# Patient Record
Sex: Female | Born: 1970 | Race: Black or African American | Hispanic: No | Smoking: Never smoker
Health system: Southern US, Community
[De-identification: ages and names within clinical notes are randomized; demographics above are authoritative.]

## PROBLEM LIST (undated history)

## (undated) DIAGNOSIS — G4700 Insomnia, unspecified: Secondary | ICD-10-CM

## (undated) DIAGNOSIS — D219 Benign neoplasm of connective and other soft tissue, unspecified: Secondary | ICD-10-CM

## (undated) DIAGNOSIS — I10 Essential (primary) hypertension: Secondary | ICD-10-CM

## (undated) DIAGNOSIS — F329 Major depressive disorder, single episode, unspecified: Secondary | ICD-10-CM

## (undated) DIAGNOSIS — F32A Depression, unspecified: Secondary | ICD-10-CM

## (undated) DIAGNOSIS — C530 Malignant neoplasm of endocervix: Secondary | ICD-10-CM

## (undated) DIAGNOSIS — D649 Anemia, unspecified: Secondary | ICD-10-CM

## (undated) DIAGNOSIS — F419 Anxiety disorder, unspecified: Secondary | ICD-10-CM

## (undated) DIAGNOSIS — F319 Bipolar disorder, unspecified: Secondary | ICD-10-CM

## (undated) HISTORY — DX: Malignant neoplasm of endocervix: C53.0

## (undated) HISTORY — PX: ABDOMINAL HYSTERECTOMY: SHX81

---

## 1996-03-26 HISTORY — PX: OTHER SURGICAL HISTORY: SHX169

## 2007-03-18 ENCOUNTER — Inpatient Hospital Stay (HOSPITAL_COMMUNITY): Admission: EM | Admit: 2007-03-18 | Discharge: 2007-03-19 | Payer: Self-pay | Admitting: *Deleted

## 2007-09-24 HISTORY — PX: DILATION AND CURETTAGE OF UTERUS: SHX78

## 2007-10-24 ENCOUNTER — Encounter (INDEPENDENT_AMBULATORY_CARE_PROVIDER_SITE_OTHER): Payer: Self-pay | Admitting: Obstetrics and Gynecology

## 2007-10-24 ENCOUNTER — Ambulatory Visit (HOSPITAL_COMMUNITY): Admission: RE | Admit: 2007-10-24 | Discharge: 2007-10-24 | Payer: Self-pay | Admitting: Obstetrics and Gynecology

## 2010-08-08 NOTE — Consult Note (Signed)
[name], Howard             ACCOUNT NO.:  [id number]   MEDICAL RECORD NO.:  [id number]          PATIENT TYPE:  INP   LOCATION:  5120                         FACILITY:  [facility]   PHYSICIAN:  Maisie Fus A. Cornett, M.D.DATE OF BIRTH:  25-May-1970   DATE OF CONSULTATION:  03/18/2007  DATE OF DISCHARGE:                                 CONSULTATION   GENERAL SURGICAL CONSULTATION   REASON FOR CONSULTATION:  Epigastric abdominal pain, nausea and  vomiting.   HISTORY OF PRESENT ILLNESS:  The patient is a 40 year old female with a  one day history of severe epigastric pain, severe nausea and severe  vomiting.  This started earlier today and came on suddenly, was located  in both right and left upper quadrants and was a 10 out of 10 with no  radiation.  This was associated with severe nausea and vomiting.  According to her mother she has been fasting for one week on her own.  She is trying to lose weight.  She was evaluated by Dr. Stacie Acres and  ultrasound showed significant sludge in her gallbladder but no stones.  There was some ascites.  She had a white count of 18,000 and a CO2 of 9.  Her lactate was normal at 1.4.  Given these findings, I was asked to see  the patient at the request of Dr. Stacie Acres in consultation for abdominal  pain.   PAST MEDICAL HISTORY:  None.   PAST SURGICAL HISTORY:  None.   MEDICATIONS:  Yaz birth control.   SOCIAL HISTORY:  Denies tobacco or alcohol use.   ALLERGIES TO MEDICINES:  No known drug allergies.   FAMILY HISTORY:  Noncontributory.   REVIEW OF SYSTEMS:  A 15 point review of systems is as above.  Please  see otherwise.   PHYSICAL EXAMINATION:  VITAL SIGNS:  Temperature is 97.  Blood pressure  is 145/92.  Respiratory rate is 19.  Pulse rate 109.  GENERAL APPEARANCE:  Obese female lying in bed in mild distress.  HEENT:  Extraocular movements intact.  Mucous membranes are dry.  NECK:  Supple, nontender.  Trachea midline without lymphadenopathy.  CHEST:  Lung sounds clear bilaterally.  Chest wall motion normal.  CARDIOVASCULAR:  Regular rate and rhythm without rub, murmur or gallop.  EXTREMITIES:  Warm, well perfused.  ABDOMEN:  Tender both over right upper and left upper quadrants.  Not  much tenderness in the left lower quadrant and right lower quadrant.  No  obvious rebound or guarding.  Bowel sounds are present.  Slight  distention.  EXTREMITIES:  Muscle tone normal.  Range of motion normal.  NEUROLOGICAL:  Examination shows motor and sensory function are grossly  intact.  Glasgow coma scale is 15.   DIAGNOSTIC STUDIES:  Ultrasound of the abdomen shows gallbladder with  sludge.  No obvious signs of cholecystitis but there is ascites.  She  has a white count of 18,000 with a left shift.  Her hemoglobin and  hematocrit are 13 and 38.  Platelet count is 263,000.  Lipase is  elevated at 75.  Sodium 134, potassium 4.4, chloride 107, CO2 9.  Glucose 101, BUN 5, creatinine 0.85.  Liver function tests normal.  Urinalysis is relatively unremarkable.  Lactate is 1.4.   IMPRESSION:  Abdominal pain.   PLAN:  At this point in time it is unclear if this is acute  cholecystitis, pancreatitis, gallstone pancreatitis or if she has peptic  ulcer disease.  She is having some vomiting which the mother showed me  is coffee-ground but not frank blood.  In any event, I feel an abdominal  CT scan would be more useful in this setting to try to get a better  grasp of what her pain is from.  She may require a HIDA study or  gastrointestinal work up to further evaluate exactly what is going on  with her.  She has been fasting but no history of any strange dietary  changes to explain this.  We will get her admitted for IV fluids and  further workup.      Thomas A. Cornett, M.D.  Electronically Signed     TAC/MEDQ  D:  03/18/2007  T:  03/18/2007  Job:  [id number]

## 2010-08-08 NOTE — H&P (Signed)
Shelby Howard, PROFIT              ACCOUNT NO.:  [id number]   MEDICAL RECORD NO.:  [id number]          PATIENT TYPE:   LOCATION:                                 FACILITY:   PHYSICIAN:  Shelby Howard, M.D.      DATE OF BIRTH:   DATE OF ADMISSION:  DATE OF DISCHARGE:                              HISTORY & PHYSICAL   CHIEF COMPLAINTS:  Breakthrough bleeding on birth control pills with  endometrial masses.   HISTORY OF PRESENT ILLNESS:  The patient is the 40 year old gravida 1,  para 1 who presented complaining of irregular vaginal bleeding.  This  started occurring in January.  She had missed some pills.  On the month  prior to this, she had not missed any pills and said she had bleeding  right before the placebo pack.  The patient is currently on Yaz.  She  does have a few fibroids on ultrasound.  She is on no new medications,  has no menopausal symptoms or vaginal discharge and denies any abdominal  pain or increased stress.  On ultrasound and sonohysterogram her uterus  measured 8.38 x 5.61 x 4.33 with normal adnexa.  She had two fibroids.  One was posterior with a large dimension being 1.78 cm and one an  anterior being 1.77 cm.  On ultrasound and sonohysterogram there were  hyperechoic masses.  The fundus measures 0.8 x 0.57 and a mass  posteriorly measuring 1.91 x 0.83, more consistent with polyps.   PAST MEDICAL HISTORY:  As above.   PAST SURGICAL HISTORY:  Unremarkable.   SOCIAL HISTORY:  Significant for tobacco use, but denies any illicit  drug use and has occasional alcohol use.   REVIEW OF SYSTEMS:  GENITOURINARY:  Breakthrough bleeding and history of  abnormal Pap smear.  HEMATOLOGICAL:  No bleeding disorders.  ENDOCRINE:  No thyroid disease.  CARDIOVASCULAR:  No heart disease.  RESPIRATORY:  No history of asthma.   PHYSICAL EXAMINATION:  VITAL SIGNS:  The patient weighs 234 pounds,  pulse 64, blood pressure 124/80.  HEENT:  Pupils equal.  Hearing is normal.   Throat is clear.  NECK:  Thyroid is not enlarged.  CARDIOVASCULAR:  Heart is regular rate and rhythm.  LUNGS:  Clear to auscultation bilaterally.  BACK:  No CVA tenderness.  ABDOMEN:  Nontender without any mass or organomegaly.  EXTREMITIES:  No cyanosis, clubbing, edema.  VAGINAL:  Within normal limits.  Vagina has normal.  Cervix nontender  without any lesions.  She did have brown discharge from residual blood.  Adnexa had no masses.   LABORATORY DATA:  Urine pregnancy test negative.  Ultrasound as above.  Hemoglobin was 12.2.  The patient decided she wants a hysteroscopy and  D&C to remove the endometrial masses and remain on Yaz.  She understands  the risks are but not limited to bleeding, infection, damage to internal  organs such as bowel, bladder, major blood vessels.      Shelby Howard, M.D.  Electronically Signed     NAD/MEDQ  D:  10/23/2007  T:  10/24/2007  Job:  [id number]

## 2010-08-08 NOTE — Op Note (Signed)
NAMEVIRGA, Shelby Howard              ACCOUNT NO.:  [id number]   MEDICAL RECORD NO.:  [id number]          PATIENT TYPE:  AMB   LOCATION:  SDC                           FACILITY:  [facility]   PHYSICIAN:  Naima A. Dillard, M.D. DATE OF BIRTH:  01-21-1971   DATE OF PROCEDURE:  10/24/2007  DATE OF DISCHARGE:                               OPERATIVE REPORT   PREOPERATIVE DIAGNOSIS:  Breakthrough bleeding and endometrial masses.   POSTOPERATIVE DIAGNOSIS:  Breakthrough bleeding.   PROCEDURE:  Dilation and curettage, hysteroscopy.   SURGEON:  Naima A. Dillard, MD   ASSISTANT:  None.   ANESTHESIA:  General.   FINDINGS:  Atrophic cavity, both ostia visualized.  No endometrial  masses seen.   SPECIMENS:  Endometrial curettings sent to Pathology.   ESTIMATED BLOOD LOSS:  Minimal.   COMPLICATIONS:  None.   The deficit was 5 mL of sorbitol.   DISPOSITION:  The patient went to PACU in stable condition.   PROCEDURE IN DETAIL:  The patient was taken to the operating room, where  she was given general anesthesia, placed in dorsolithotomy position, and  prepped and draped in a normal sterile fashion.  A bivalve speculum was  placed into the vagina.  The anterior lip of the cervix was grasped with  a single-tooth tenaculum, 20 mL was used for cervical block.  The cervix  was dilated with Pratt dilators up to 21.  The hysteroscope was placed  into the uterine cavity, it was totally normal and atrophic.  Both ostia  were visualized.  No endometrial masses or submucosal fibroids were  seen.  We did let the fluid out of the cavity and watch it collapse and  then re-inflated with the fluid and again no endometrial polyps or  masses were seen.  Hysteroscope was removed from the uterus.  A sharp  curettage  was done and the endometrial curettings were sent to Pathology.  All  instruments were removed from the vagina and cervix.  Sponge, lap, and  needle counts were correct.  Hemostasis was assured at  tenaculum site  using silver nitrate and pressure.  The patient went to PACU in stable  condition.      Naima A. Normand Sloop, M.D.  Electronically Signed     NAD/MEDQ  D:  10/24/2007  T:  10/25/2007  Job:  [id number]

## 2010-08-08 NOTE — H&P (Signed)
NAMEDESERA, GRAFFEO              ACCOUNT NO.:  [id number]   MEDICAL RECORD NO.:  [id number]          PATIENT TYPE:   LOCATION:                                 FACILITY:   PHYSICIAN:  Naima A. Dillard, M.D.      DATE OF BIRTH:   DATE OF ADMISSION:  DATE OF DISCHARGE:                              HISTORY & PHYSICAL   CHIEF COMPLAINTS:  Breakthrough bleeding on birth control pills with  endometrial masses.   HISTORY OF PRESENT ILLNESS:  The patient is the 40 year old gravida 1,  para 1 who presented complaining of irregular vaginal bleeding.  This  started occurring in January.  She had missed some pills.  On the month  prior to this, she had not missed any pills and said she had bleeding  right before the placebo pack.  The patient is currently on Yaz.  She  does have a few fibroids on ultrasound.  She is on no new medications,  has no menopausal symptoms or vaginal discharge and denies any abdominal  pain or increased stress.  On ultrasound and sonohysterogram her uterus  measured 8.38 x 5.61 x 4.33 with normal adnexa.  She had two fibroids.  One was posterior with a large dimension being 1.78 cm and one an  anterior being 1.77 cm.  On ultrasound and sonohysterogram there were  hyperechoic masses.  The fundus measures 0.8 x 0.57 and a mass  posteriorly measuring 1.91 x 0.83, more consistent with polyps.   PAST MEDICAL HISTORY:  As above.   PAST SURGICAL HISTORY:  Unremarkable.   SOCIAL HISTORY:  Significant for tobacco use, but denies any illicit  drug use and has occasional alcohol use.   REVIEW OF SYSTEMS:  GENITOURINARY:  Breakthrough bleeding and history of  abnormal Pap smear.  HEMATOLOGICAL:  No bleeding disorders.  ENDOCRINE:  No thyroid disease.  CARDIOVASCULAR:  No heart disease.  RESPIRATORY:  No history of asthma.   PHYSICAL EXAMINATION:  VITAL SIGNS:  The patient weighs 234 pounds,  pulse 64, blood pressure 124/80.  HEENT:  Pupils equal.  Hearing is normal.   Throat is clear.  NECK:  Thyroid is not enlarged.  CARDIOVASCULAR:  Heart is regular rate and rhythm.  LUNGS:  Clear to auscultation bilaterally.  BACK:  No CVA tenderness.  ABDOMEN:  Nontender without any mass or organomegaly.  EXTREMITIES:  No cyanosis, clubbing, edema.  VAGINAL:  Within normal limits.  Vagina has normal.  Cervix nontender  without any lesions.  She did have brown discharge from residual blood.  Adnexa had no masses.   LABORATORY DATA:  Urine pregnancy test negative.  Ultrasound as above.  Hemoglobin was 12.2.  The patient decided she wants a hysteroscopy and  D&C to remove the endometrial masses and remain on Yaz.  She understands  the risks are but not limited to bleeding, infection, damage to internal  organs such as bowel, bladder, major blood vessels.      Naima A. Normand Sloop, M.D.     NAD/MEDQ  D:  10/23/2007  T:  10/24/2007  Job:  [id number]

## 2010-08-11 NOTE — Discharge Summary (Signed)
NAMEDAINELLE, HUN              ACCOUNT NO.:  [id number]   MEDICAL RECORD NO.:  [id number]          PATIENT TYPE:  INP   LOCATION:  5120                         FACILITY:  [facility]   PHYSICIAN:  Lennie Muckle, MD      DATE OF BIRTH:  May 06, 1970   DATE OF ADMISSION:  03/17/2007  DATE OF DISCHARGE:  03/19/2007                               DISCHARGE SUMMARY   FINAL DIAGNOSES:  Gastroenteritis.   HOSPITAL COURSE:  Ms. Hollar is a 40 year old female who was admitted  with nausea and vomiting and pain throughout most of the abdomen. Workup  included an ultrasound as well as a Hida scan. Ultrasound revealed  question of some sludge within the gallbladder. No stones. Hida scan was  performed, which was negative. She did receive IV fluids and anti-nausea  medication over the course of the day with hydration. She began to  improve in her nausea and vomiting and complete resolution on the day of  discharge, March 19, 2007. Had normalization of her white count and  had no further complaints of abdominal pain.   FINAL DIAGNOSES:  Gastroenteritis.   DISPOSITION:  Discharge to home. No need for surgical followup. Is  released to her own care.   FOLLOWUP:  With primary care physician on an as needed basis.      Lennie Muckle, MD  Electronically Signed     ALA/MEDQ  D:  04/16/2007  T:  04/16/2007  Job:  [id number]

## 2010-12-22 LAB — CBC
Hemoglobin: 12.1
RBC: 4.35
RDW: 13.7

## 2010-12-29 LAB — CBC
HCT: 42.1
MCV: 82.1
Platelets: 263
RBC: 5.17 — ABNORMAL HIGH
RDW: 14.5
RDW: 14.8
WBC: 20.2 — ABNORMAL HIGH

## 2010-12-29 LAB — COMPREHENSIVE METABOLIC PANEL
ALT: 11
AST: 17
AST: 19
Albumin: 3.7
Alkaline Phosphatase: 73
BUN: 3 — ABNORMAL LOW
CO2: 10 — ABNORMAL LOW
Calcium: 8.4
Calcium: 9.2
Chloride: 104
Chloride: 107
Creatinine, Ser: 0.8
Creatinine, Ser: 0.85
GFR calc Af Amer: >60
GFR calc Af Amer: >60
GFR calc non Af Amer: >60
Glucose, Bld: 135 — ABNORMAL HIGH
Potassium: 4.4
Potassium: 5.1
Sodium: 130 — ABNORMAL LOW
Sodium: 134 — ABNORMAL LOW
Total Bilirubin: 0.9
Total Protein: 8.2

## 2010-12-29 LAB — LIPASE, BLOOD: Lipase: 75 — ABNORMAL HIGH

## 2010-12-29 LAB — DIFFERENTIAL
Basophils Relative: 0
Eosinophils Absolute: 0 — ABNORMAL LOW
Eosinophils Relative: 0
Lymphocytes Relative: 6 — ABNORMAL LOW
Lymphs Abs: 1
Monocytes Relative: 5
Neutro Abs: 17.3 — ABNORMAL HIGH
Neutrophils Relative %: 93 — ABNORMAL HIGH

## 2010-12-29 LAB — URINALYSIS, ROUTINE W REFLEX MICROSCOPIC
Protein, ur: NEGATIVE
Specific Gravity, Urine: 1.015

## 2010-12-29 LAB — LACTIC ACID, PLASMA: Lactic Acid, Venous: 1.4

## 2011-01-01 ENCOUNTER — Other Ambulatory Visit: Payer: Self-pay | Admitting: Obstetrics and Gynecology

## 2011-01-05 ENCOUNTER — Other Ambulatory Visit: Payer: Self-pay | Admitting: Obstetrics and Gynecology

## 2011-02-06 ENCOUNTER — Other Ambulatory Visit (HOSPITAL_COMMUNITY): Payer: BC Managed Care – PPO

## 2011-02-14 ENCOUNTER — Encounter (HOSPITAL_COMMUNITY): Payer: Self-pay | Admitting: Pharmacist

## 2011-02-14 NOTE — Progress Notes (Signed)
Called pt to advise to stop Phentermine by 11/23.  She verbalized understanding.

## 2011-02-22 ENCOUNTER — Encounter (HOSPITAL_COMMUNITY): Payer: Self-pay

## 2011-02-22 ENCOUNTER — Encounter (HOSPITAL_COMMUNITY)
Admission: RE | Admit: 2011-02-22 | Discharge: 2011-02-22 | Disposition: A | Discharge status: Home / Self Care | Payer: BC Managed Care – PPO | Source: Ambulatory Visit | Attending: Obstetrics and Gynecology | Admitting: Obstetrics and Gynecology

## 2011-02-22 HISTORY — DX: Insomnia, unspecified: G47.00

## 2011-02-22 HISTORY — DX: Major depressive disorder, single episode, unspecified: F32.9

## 2011-02-22 HISTORY — DX: Anxiety disorder, unspecified: F41.9

## 2011-02-22 HISTORY — DX: Anemia, unspecified: D64.9

## 2011-02-22 HISTORY — DX: Bipolar disorder, unspecified: F31.9

## 2011-02-22 HISTORY — DX: Depression, unspecified: F32.A

## 2011-02-22 HISTORY — DX: Benign neoplasm of connective and other soft tissue, unspecified: D21.9

## 2011-02-22 LAB — CBC
HCT: 32 % — ABNORMAL LOW (ref 36.0–46.0)
MCH: 26.5 pg (ref 26.0–34.0)
MCHC: 32.8 g/dL (ref 30.0–36.0)
MCV: 80.8 fL (ref 78.0–100.0)
RDW: 16.7 % — ABNORMAL HIGH (ref 11.5–15.5)

## 2011-02-22 LAB — BASIC METABOLIC PANEL
BUN: 16 mg/dL (ref 6–23)
Chloride: 102 mEq/L (ref 96–112)
Creatinine, Ser: 0.73 mg/dL (ref 0.50–1.10)
GFR calc Af Amer: >90 mL/min (ref >90)

## 2011-02-22 NOTE — Pre-Procedure Instructions (Signed)
Ok to see patient DOS. 

## 2011-02-22 NOTE — Patient Instructions (Addendum)
   Your procedure is scheduled on:  Friday, Dec 7th  Enter through the [employer] [facility] at: 8:00am Pick up the phone at the desk and dial [phone] and inform us of your arrival.  Please call this number if you have any problems the morning of surgery: [phone]  Remember: Do not eat food after midnight: Thursday Do not drink clear liquids after: Thursday Take these medicines the morning of surgery with a SIP OF WATER:  Wellbutrin and Xanax if needed  Do not wear jewelry, make-up, or FINGER nail polish Do not wear lotions, powders, perfumes or deodorant. Do not shave 48 hours prior to surgery. Do not bring valuables to the hospital.  Patients discharged on the day of surgery will not be allowed to drive home.   Home with mother Addelynn Batte  cell [phone]  Remember to use your hibiclens as instructed.Please shower with 1/2 bottle the evening before your surgery and the other 1/2 bottle the morning of surgery.

## 2011-03-01 NOTE — H&P (Signed)
Shelby Howard is an 40 y.o. female. 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  Pt presented with complaint of heavy vaginal bleeding and pap was significant for LGSIL.  Pt then had coploscopy .    Pertinent Gynecological History: Menses: flow is excessive with use of 15-20 pads or tampons on heaviest days Bleeding: see above Contraception: abstinence DES exposure: unknown Blood transfusions: none Sexually transmitted diseases: HPV Previous GYN Procedures: DNC  Last mammogram: na Date: na Last pap: abnormal: see above Date: 1012 OB History: G1, P1   Menstrual History: Menarche age: 37 No LMP recorded.    Past Medical History  Diagnosis Date  . Fibroids   . Bipolar disorder   . Anxiety   . Depression   . Insomnia   . Anemia     history    Past Surgical History  Procedure Date  . Dilation and curettage of uterus 09/2007    resection of polyp  . Svd 1998    x 1    No family history on file.  Social History:  reports that she has never smoked. She has never used smokeless tobacco. She reports that she drinks about 2.4 ounces of alcohol per week. She reports that she does not use illicit drugs.  Allergies: No Known Allergies  No prescriptions prior to admission    Review of Systems  Constitutional: Negative.   HENT: Negative.   Eyes: Negative.   Respiratory: Negative.   Gastrointestinal: Negative.   Genitourinary: Negative.   Musculoskeletal: Negative.   Skin: Negative.   Neurological: Negative.   Endo/Heme/Allergies: Negative.   Psychiatric/Behavioral: Negative.     There were no vitals taken for this visit. Physical Exam .108/76 wt 255 p88 Physical Examination: General appearance - alert, well appearing, and in no distress Mouth - mucous membranes moist, pharynx normal without lesions Neck - supple, no significant adenopathy Chest - clear to auscultation, no wheezes, rales or rhonchi, symmetric air entry Heart - normal  rate, regular rhythm, normal S1, S2, no murmurs, rubs, clicks or gallops Abdomen - soft, nontender, nondistended, no masses or organomegaly Breasts - breasts appear normal, no suspicious masses, no skin or nipple changes or axillary nodes Pelvic - normal external genitalia, vulva, vagina, cervix, uterus and adnexa Musculoskeletal - no joint tenderness, deformity or swelling Extremities - peripheral pulses normal, no pedal edema, no clubbing or cyanosis No results found for this or any previous visit (from the past 24 hour(s)).  No results found. colpo  Adenocarcinoma in situon colpo.   EMBX benign   Assessment/Plan: Plan CKC  Pt understands risks of bleeding, infection and damage to the cervix  Lliam Hoh A 03/01/2011, 6:06 PM

## 2011-03-02 ENCOUNTER — Other Ambulatory Visit: Payer: Self-pay | Admitting: Obstetrics and Gynecology

## 2011-03-02 ENCOUNTER — Ambulatory Visit (HOSPITAL_COMMUNITY)
Admission: RE | Admit: 2011-03-02 | Discharge: 2011-03-02 | Disposition: A | Discharge status: Home / Self Care | Payer: BC Managed Care – PPO | Source: Ambulatory Visit | Attending: Obstetrics and Gynecology | Admitting: Obstetrics and Gynecology

## 2011-03-02 ENCOUNTER — Encounter (HOSPITAL_COMMUNITY)
Admission: RE | Disposition: A | Discharge status: Home / Self Care | Payer: Self-pay | Source: Ambulatory Visit | Attending: Obstetrics and Gynecology

## 2011-03-02 ENCOUNTER — Encounter (HOSPITAL_COMMUNITY): Payer: Self-pay | Admitting: Anesthesiology

## 2011-03-02 ENCOUNTER — Ambulatory Visit (HOSPITAL_COMMUNITY): Payer: BC Managed Care – PPO | Admitting: Anesthesiology

## 2011-03-02 ENCOUNTER — Encounter (HOSPITAL_COMMUNITY): Payer: Self-pay | Admitting: *Deleted

## 2011-03-02 DIAGNOSIS — Z01812 Encounter for preprocedural laboratory examination: Secondary | ICD-10-CM | POA: Insufficient documentation

## 2011-03-02 DIAGNOSIS — D069 Carcinoma in situ of cervix, unspecified: Secondary | ICD-10-CM | POA: Insufficient documentation

## 2011-03-02 DIAGNOSIS — Z01818 Encounter for other preprocedural examination: Secondary | ICD-10-CM | POA: Insufficient documentation

## 2011-03-02 HISTORY — PX: CERVICAL CONIZATION W/BX: SHX1330

## 2011-03-02 SURGERY — CONE BIOPSY, CERVIX
Anesthesia: Choice

## 2011-03-02 MED ORDER — MIDAZOLAM HCL 5 MG/5ML IJ SOLN
INTRAMUSCULAR | Status: DC | PRN
  Administered 2011-03-02: 2 mg via INTRAVENOUS

## 2011-03-02 MED ORDER — FENTANYL CITRATE 0.05 MG/ML IJ SOLN: 25.0000 ug | INTRAMUSCULAR | Status: DC | PRN

## 2011-03-02 MED ORDER — PROPOFOL 10 MG/ML IV EMUL
INTRAVENOUS | Status: AC
  Filled 2011-03-02: qty 20

## 2011-03-02 MED ORDER — KETOROLAC TROMETHAMINE 30 MG/ML IJ SOLN
INTRAMUSCULAR | Status: DC | PRN
  Administered 2011-03-02: 30 mg via INTRAVENOUS

## 2011-03-02 MED ORDER — LACTATED RINGERS IV SOLN
INTRAVENOUS | Status: DC
  Administered 2011-03-02: 09:00:00 via INTRAVENOUS

## 2011-03-02 MED ORDER — IBUPROFEN 200 MG PO TABS: 200.0000 mg | ORAL_TABLET | Freq: Four times a day (QID) | ORAL | Status: AC | PRN

## 2011-03-02 MED ORDER — VASOPRESSIN 20 UNIT/ML IJ SOLN
INTRAMUSCULAR | Status: DC | PRN
  Administered 2011-03-02: 20 [IU]

## 2011-03-02 MED ORDER — ACETIC ACID 4% SOLUTION
Status: DC | PRN
  Administered 2011-03-02: 1 via TOPICAL

## 2011-03-02 MED ORDER — HYDROCODONE-ACETAMINOPHEN 5-500 MG PO TABS: 1.0000 | ORAL_TABLET | Freq: Four times a day (QID) | ORAL | Status: AC | PRN

## 2011-03-02 MED ORDER — FENTANYL CITRATE 0.05 MG/ML IJ SOLN
INTRAMUSCULAR | Status: AC
  Filled 2011-03-02: qty 2

## 2011-03-02 MED ORDER — ONDANSETRON HCL 4 MG/2ML IJ SOLN
INTRAMUSCULAR | Status: AC
  Filled 2011-03-02: qty 2

## 2011-03-02 MED ORDER — KETOROLAC TROMETHAMINE 30 MG/ML IJ SOLN
INTRAMUSCULAR | Status: AC
  Filled 2011-03-02: qty 1

## 2011-03-02 MED ORDER — MIDAZOLAM HCL 2 MG/2ML IJ SOLN
INTRAMUSCULAR | Status: AC
  Filled 2011-03-02: qty 2

## 2011-03-02 MED ORDER — FENTANYL CITRATE 0.05 MG/ML IJ SOLN
INTRAMUSCULAR | Status: DC | PRN
  Administered 2011-03-02 (×2): 100 ug via INTRAVENOUS

## 2011-03-02 MED ORDER — LIDOCAINE HCL (CARDIAC) 20 MG/ML IV SOLN
INTRAVENOUS | Status: AC
  Filled 2011-03-02: qty 10

## 2011-03-02 MED ORDER — METOCLOPRAMIDE HCL 5 MG/ML IJ SOLN: 10.0000 mg | Freq: Once | INTRAMUSCULAR | Status: DC | PRN

## 2011-03-02 MED ORDER — ONDANSETRON HCL 4 MG/2ML IJ SOLN
INTRAMUSCULAR | Status: DC | PRN
  Administered 2011-03-02: 4 mg via INTRAVENOUS

## 2011-03-02 MED ORDER — PROPOFOL 10 MG/ML IV EMUL
INTRAVENOUS | Status: DC | PRN
  Administered 2011-03-02: 200 mg via INTRAVENOUS

## 2011-03-02 MED ORDER — DEXAMETHASONE SODIUM PHOSPHATE 4 MG/ML IJ SOLN
INTRAMUSCULAR | Status: DC | PRN
  Administered 2011-03-02: 10 mg via INTRAVENOUS

## 2011-03-02 MED ORDER — MEPERIDINE HCL 25 MG/ML IJ SOLN: 6.2500 mg | INTRAMUSCULAR | Status: DC | PRN

## 2011-03-02 SURGICAL SUPPLY — 27 items
APPLICATOR COTTON TIP 6IN STRL (MISCELLANEOUS) IMPLANT
BLADE SURG 11 STRL SS (BLADE) ×2 IMPLANT
CLOTH BEACON ORANGE TIMEOUT ST (SAFETY) ×2 IMPLANT
CONTAINER PREFILL 10% NBF 60ML (FORM) ×2 IMPLANT
COUNTER NEEDLE 1200 MAGNETIC (NEEDLE) IMPLANT
ELECT REM PT RETURN 9FT ADLT (ELECTROSURGICAL) ×2
ELECTRODE REM PT RTRN 9FT ADLT (ELECTROSURGICAL) ×1 IMPLANT
GAUZE SPONGE 4X4 16PLY XRAY LF (GAUZE/BANDAGES/DRESSINGS) IMPLANT
GLOVE BIO SURGEON STRL SZ 6.5 (GLOVE) ×4 IMPLANT
GLOVE BIOGEL PI IND STRL 7.0 (GLOVE) ×1 IMPLANT
GLOVE BIOGEL PI INDICATOR 7.0 (GLOVE) ×1
GOWN PREVENTION PLUS LG XLONG (DISPOSABLE) ×4 IMPLANT
NEEDLE SPNL 22GX3.5 QUINCKE BK (NEEDLE) ×2 IMPLANT
NS IRRIG 1000ML POUR BTL (IV SOLUTION) ×2 IMPLANT
PACK VAGINAL MINOR WOMEN LF (CUSTOM PROCEDURE TRAY) ×2 IMPLANT
PENCIL BUTTON HOLSTER BLD 10FT (ELECTRODE) ×2 IMPLANT
SCOPETTES 8  STERILE (MISCELLANEOUS) ×2
SCOPETTES 8 STERILE (MISCELLANEOUS) ×2 IMPLANT
SPONGE SURGIFOAM ABS GEL 12-7 (HEMOSTASIS) IMPLANT
SUT VIC AB 0 CT1 27 (SUTURE) ×4
SUT VIC AB 0 CT1 27XBRD ANBCTR (SUTURE) ×4 IMPLANT
SYR 20CC LL (SYRINGE) ×2 IMPLANT
SYR TB 1ML 25GX5/8 (SYRINGE) IMPLANT
TOWEL OR 17X24 6PK STRL BLUE (TOWEL DISPOSABLE) ×4 IMPLANT
TUBING NON-CON 1/4 X 20 CONN (TUBING) IMPLANT
WATER STERILE IRR 1000ML POUR (IV SOLUTION) ×2 IMPLANT
YANKAUER SUCT BULB TIP NO VENT (SUCTIONS) IMPLANT

## 2011-03-02 NOTE — Transfer of Care (Signed)
Immediate Anesthesia Transfer of Care Note  Patient: Shelby Howard  Procedure(s) Performed:  CONIZATION CERVIX WITH BIOPSY - Cold Knife Conization, Endocervical Curretage  Patient Location: PACU  Anesthesia Type: General  Level of Consciousness: awake, alert  and oriented  Airway & Oxygen Therapy: Patient Spontanous Breathing and Patient connected to nasal cannula oxygen  Post-op Assessment: Report given to PACU RN and Post -op Vital signs reviewed and stable  Post vital signs: Reviewed and stable  Complications: No apparent anesthesia complications

## 2011-03-02 NOTE — Anesthesia Postprocedure Evaluation (Signed)
Anesthesia Post Note  Patient: Shelby Howard  Procedure(s) Performed:  CONIZATION CERVIX WITH BIOPSY - Cold Knife Conization, Endocervical Curretage  Anesthesia type: General  Patient location: PACU  Post pain: Pain level controlled  Post assessment: Post-op Vital signs reviewed  Last Vitals:  Filed Vitals:   03/02/11 0812  BP: 139/81  Pulse: 76  Temp: 36.6 C  Resp: 18    Post vital signs: Reviewed  Level of consciousness: sedated  Complications: No apparent anesthesia complicationsfj

## 2011-03-02 NOTE — Interval H&P Note (Signed)
History and Physical Interval Note:  03/02/2011 9:15 AM  Shelby Howard  has presented today for surgery, with the diagnosis of Adenocarcinoma IN SITU  The various methods of treatment have been discussed with the patient and family. After consideration of risks, benefits and other options for treatment, the patient has consented to  Procedure(s): CONIZATION CERVIX WITH BIOPSY as a surgical intervention .  The patients' history has been reviewed, patient examined, no change in status, stable for surgery.  I have reviewed the patients' chart and labs.  Questions were answered to the patient's satisfaction.     [name]  Date of Initial H&P: 12/6  History reviewed, patient examined, no change in status, stable for surgery.

## 2011-03-02 NOTE — Anesthesia Preprocedure Evaluation (Signed)
Anesthesia Evaluation  Patient identified by MRN, date of birth, ID band Patient awake    Reviewed: Allergy & Precautions, H&P , NPO status , Patient's Chart, lab work & pertinent test results  Airway Mallampati: III TM Distance: >3 FB Neck ROM: full    Dental  (+) Teeth Intact   Pulmonary neg pulmonary ROS,    Pulmonary exam normal       Cardiovascular neg cardio ROS regular Normal    Neuro/Psych PSYCHIATRIC DISORDERS BipolarNegative Neurological ROS     GI/Hepatic negative GI ROS, Neg liver ROS,   Endo/Other  Negative Endocrine ROSMorbid obesity  Renal/GU negative Renal ROS     Musculoskeletal   Abdominal Normal abdominal exam  (+)   Peds  Hematology negative hematology ROS (+)   Anesthesia Other Findings   Reproductive/Obstetrics                           Anesthesia Physical Anesthesia Plan  ASA: III  Anesthesia Plan: General LMA   Post-op Pain Management:    Induction:   Airway Management Planned:   Additional Equipment:   Intra-op Plan:   Post-operative Plan:   Informed Consent: I have reviewed the patients History and Physical, chart, labs and discussed the procedure including the risks, benefits and alternatives for the proposed anesthesia with the patient or authorized representative who has indicated his/her understanding and acceptance.   Dental Advisory Given  Plan Discussed with: Anesthesiologist, CRNA and Surgeon  Anesthesia Plan Comments:         Anesthesia Quick Evaluation

## 2011-03-02 NOTE — Op Note (Signed)
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 The patient verbalized understanding and consent signed and witnessed. The patient was placed under GENERAL anesthesia per anesthesiologist and prepped and draped in the normal sterile fashion.  A TIME OUT was performed per protocol.  A coated bivalve speculum was placed in the patient's vagina and 2 zero vicryl stitches were placed, one at the 3:00 position and one at the 9:00 position.   A total of 20 cc of dilute vasopressinwas used and placed at 12, 6,7 and 5 oclock. There was 20 units of vasopressin in 100cc of normal saline. Cold knife cone was performed without difficulty and a stitch placed at 12:00 and specimen sent to pathology. The base of the cervix was cauterized with ball-tipped cautery. Monsel solution was placed to aid hemostasis. There was still a small amount of oozing noted and Gelfoam placed to assure hemostasis. All instruments were removed. Sponge lap and needle count was correct. Patient tolerated the procedure well and was awaiting transfer to the recovery room in good condition.

## 2011-03-05 ENCOUNTER — Encounter (HOSPITAL_COMMUNITY): Payer: Self-pay | Admitting: Obstetrics and Gynecology

## 2011-04-03 ENCOUNTER — Encounter: Payer: Self-pay | Admitting: Gynecologic Oncology

## 2011-04-04 ENCOUNTER — Ambulatory Visit: Payer: BC Managed Care – PPO | Admitting: Gynecologic Oncology

## 2011-04-19 ENCOUNTER — Encounter: Payer: Self-pay | Admitting: Gynecologic Oncology

## 2011-04-20 ENCOUNTER — Ambulatory Visit: Payer: BC Managed Care – PPO | Attending: Gynecology | Admitting: Gynecology

## 2011-04-20 ENCOUNTER — Encounter: Payer: Self-pay | Admitting: Gynecology

## 2011-04-20 VITALS — BP 134/76 | HR 80 | Temp 97.9°F | Resp 18 | Ht 68.9 in | Wt 260.9 lb

## 2011-04-20 DIAGNOSIS — Z79899 Other long term (current) drug therapy: Secondary | ICD-10-CM | POA: Insufficient documentation

## 2011-04-20 DIAGNOSIS — D069 Carcinoma in situ of cervix, unspecified: Secondary | ICD-10-CM | POA: Insufficient documentation

## 2011-04-20 NOTE — Patient Instructions (Signed)
Contact Dr. Normand Sloop to arrange to undergo hysterectomy

## 2011-04-20 NOTE — Progress Notes (Signed)
Consult Note: Gyn-Onc   Shelby Howard 41 y.o. female  Chief Complaint  Patient presents with  . Endocervical Adenocarcinoma in situ    New pt    HPI: 41 year old African American female seen in consultation at the request of Dr. Normand Sloop regarding management of adenocarcinoma in situ of the endocervix.  The patient had not had Pap smear in several years and presented with heavy uterine bleeding which was attributed to her known uterine fibroids. Pap smear showed atypical glandular cells and after a colposcopy and biopsy showing endocervical adenocarcinoma in situ the patient underwent a cold knife conization on December 7.  Final pathology showed an endocervical adenocarcinoma in situ with negative margins. Endocervical curettage was also negative. The patient's had an uncomplicated postoperative course.  Past gynecologic history includes known fibroids with heavy menstrual bleeding and some pelvic pressure.  Obstetrical history gravida 1  No Known Allergies  Past Medical History  Diagnosis Date  . Fibroids   . Bipolar disorder   . Anxiety   . Depression   . Insomnia   . Anemia     history  . Endocervical adenocarcinoma     Past Surgical History  Procedure Date  . Dilation and curettage of uterus 09/2007    resection of polyp  . Svd 1998    x 1  . Cervical conization w/bx 03/02/2011    Procedure: CONIZATION CERVIX WITH BIOPSY;  Surgeon: Michael Litter, MD;  Location: [facility];  Service: Gynecology;  Laterality: N/A;  Cold Knife Conization, Endocervical Curretage    Current Outpatient Prescriptions  Medication Sig Dispense Refill  . ALPRAZolam (XANAX) 0.5 MG tablet Take 0.25-0.5 mg by mouth as needed. For Anxiety       . buPROPion (WELLBUTRIN XL) 300 MG 24 hr tablet Take 300 mg by mouth every morning.        . divalproex (DEPAKOTE) 500 MG DR tablet Take 1,000 mg by mouth at bedtime.        . lamoTRIgine (LAMICTAL) 200 MG tablet Take 200 mg by mouth at bedtime.        .  phentermine 37.5 MG capsule Take 37.5 mg by mouth every morning.        . zaleplon (SONATA) 10 MG capsule Take 10 mg by mouth at bedtime as needed. For Sleep.       Marland Kitchen zolpidem (AMBIEN) 10 MG tablet Take 10 mg by mouth at bedtime as needed.          History   Social History  . Marital Status: Divorced    Spouse Name: N/A    Number of Children: N/A  . Years of Education: N/A   Occupational History  . Not on file.   Social History Main Topics  . Smoking status: Never Smoker   . Smokeless tobacco: Never Used  . Alcohol Use: 2.4 oz/week    4 Glasses of wine per week     weekly -wine  . Drug Use: No  . Sexually Active: No   Other Topics Concern  . Not on file   Social History Narrative  . No narrative on file    Family History  Problem Relation Age of Onset  . Hypertension Mother     Review of Systems:  Vitals: Blood pressure 134/76, pulse 80, temperature 97.9 F (36.6 C), temperature source Oral, resp. rate 18, height 5' 8.9" (1.75 m), weight 260 lb 14.4 oz (118.343 kg), last menstrual period 03/26/2011.  Physical Exam:  Assessment/Plan:   CLARKE-PEARSON,Daltin Crist  L, MD 04/20/2011, 1:59 PM                         Consult Note: Gyn-Onc   Shelby Howard 41 y.o. female  Chief Complaint  Patient presents with  . Endocervical Adenocarcinoma in situ    New pt    Interval History:   HPI:  No Known Allergies  Past Medical History  Diagnosis Date  . Fibroids   . Bipolar disorder   . Anxiety   . Depression   . Insomnia   . Anemia     history  . Endocervical adenocarcinoma     Past Surgical History  Procedure Date  . Dilation and curettage of uterus 09/2007    resection of polyp  . Svd 1998    x 1  . Cervical conization w/bx 03/02/2011    Procedure: CONIZATION CERVIX WITH BIOPSY;  Surgeon: Michael Litter, MD;  Location: [facility];  Service: Gynecology;  Laterality: N/A;  Cold Knife Conization, Endocervical Curretage    Current  Outpatient Prescriptions  Medication Sig Dispense Refill  . ALPRAZolam (XANAX) 0.5 MG tablet Take 0.25-0.5 mg by mouth as needed. For Anxiety       . buPROPion (WELLBUTRIN XL) 300 MG 24 hr tablet Take 300 mg by mouth every morning.        . divalproex (DEPAKOTE) 500 MG DR tablet Take 1,000 mg by mouth at bedtime.        . lamoTRIgine (LAMICTAL) 200 MG tablet Take 200 mg by mouth at bedtime.        . phentermine 37.5 MG capsule Take 37.5 mg by mouth every morning.        . zaleplon (SONATA) 10 MG capsule Take 10 mg by mouth at bedtime as needed. For Sleep.       Marland Kitchen zolpidem (AMBIEN) 10 MG tablet Take 10 mg by mouth at bedtime as needed.          History   Social History  . Marital Status: Divorced    Spouse Name: N/A    Number of Children: N/A  . Years of Education: N/A   Occupational History  . Not on file.   Social History Main Topics  . Smoking status: Never Smoker   . Smokeless tobacco: Never Used  . Alcohol Use: 2.4 oz/week    4 Glasses of wine per week     weekly -wine  . Drug Use: No  . Sexually Active: No   Other Topics Concern  . Not on file   Social History Narrative  . No narrative on file    Family History  Problem Relation Age of Onset  . Hypertension Mother     Review of Systems: 10 point review of systems is negative except as noted above  Vitals: Blood pressure 134/76, pulse 80, temperature 97.9 F (36.6 C), temperature source Oral, resp. rate 18, height 5' 8.9" (1.75 m), weight 260 lb 14.4 oz (118.343 kg), last menstrual period 03/26/2011.  Physical Exam: In general patient is a pleasant African American female no acute distress  HEENT is negative  Neck is supple without thyromegaly  There is no supraclavicular or inguinal adenopathy. The abdomen is soft nontender no masses organomegaly ascites or hernias are noted  Pelvic exam EGBUS vagina bladder urethra are normal. Cervix status post cold knife conization and is well healed.  The uterus  seems to be slightly enlarged but is difficult to fully appreciate given the  patient's habitus.    Assessment/Plan: Adenocarcinoma in situ of the cervix. Symptomatic uterine fibroids.  I discussed management options with the patient and would recommend she undergo a hysterectomy for treatment of both her symptomatic fibroids and her adenocarcinoma in situ of the cervix. The ovaries could be left in situ.  Will return the patient to the care of Dr. Normand Sloop to arrange surgery had a convenient time.  All the patient's questions are answered   CLARKE-PEARSON,Ozie Lupe L, MD 04/20/2011, 1:59 PM

## 2011-08-10 ENCOUNTER — Ambulatory Visit (INDEPENDENT_AMBULATORY_CARE_PROVIDER_SITE_OTHER): Payer: BC Managed Care – PPO | Admitting: Obstetrics and Gynecology

## 2011-08-10 ENCOUNTER — Encounter: Payer: Self-pay | Admitting: Obstetrics and Gynecology

## 2011-08-10 VITALS — BP 122/72 | Ht 67.0 in | Wt 265.0 lb

## 2011-08-10 DIAGNOSIS — D069 Carcinoma in situ of cervix, unspecified: Secondary | ICD-10-CM

## 2011-08-10 NOTE — Progress Notes (Signed)
Shelby Howard is an 41 y.o. female. Who presented me for a follow up pap and complaint of heavy vaginal bleeding.  Her pap was LGSIL however her colpo biopsy and cone bx was significant for CIS.  Her EMBX was benign. She was seen by Dr Loree Fee and was recommended to have        Past Medical History  Diagnosis Date  . Fibroids   . Bipolar disorder   . Anxiety   . Depression   . Insomnia   . Anemia     history  . Endocervical adenocarcinoma     Past Surgical History  Procedure Date  . Dilation and curettage of uterus 09/2007    resection of polyp  . Svd 1998    x 1  . Cervical conization w/bx 03/02/2011    Procedure: CONIZATION CERVIX WITH BIOPSY;  Surgeon: Michael Litter, MD;  Location: [facility];  Service: Gynecology;  Laterality: N/A;  Cold Knife Conization, Endocervical Curretage    Family History  Problem Relation Age of Onset  . Hypertension Mother     Social History:  reports that she has never smoked. She has never used smokeless tobacco. She reports that she drinks about 2.4 ounces of alcohol per week. She reports that she does not use illicit drugs.  Allergies: No Known Allergies   (Not in a hospital admission)  ROS  Blood pressure 122/72, height 5\' 7"  (1.702 m), weight 265 lb (120.203 kg), last menstrual period 08/07/2011. Physical Exam  No results found for this or any previous visit (from the past 24 hour(s)).  No results found.  Assessment/Plan  Shelby Howard A 08/10/2011, 2:54 PM

## 2011-08-16 ENCOUNTER — Other Ambulatory Visit: Payer: Self-pay | Admitting: Obstetrics and Gynecology

## 2011-08-16 ENCOUNTER — Telehealth: Payer: Self-pay | Admitting: Obstetrics and Gynecology

## 2011-08-16 NOTE — Telephone Encounter (Signed)
[facility]; Cystoscopy scheduled for 09/06/11 @ 12:30 with ND/AR.  [employer] effective 12/25/10 - Plan pays 80/20 after a $500 deductible. Pre-op due $299.69  - Adrianne Pridgen

## 2011-08-23 ENCOUNTER — Encounter (HOSPITAL_COMMUNITY): Payer: Self-pay | Admitting: Pharmacist

## 2011-08-27 ENCOUNTER — Encounter (HOSPITAL_COMMUNITY): Payer: Self-pay

## 2011-08-27 ENCOUNTER — Encounter (HOSPITAL_COMMUNITY)
Admission: RE | Admit: 2011-08-27 | Discharge: 2011-08-27 | Disposition: A | Discharge status: Home / Self Care | Payer: BC Managed Care – PPO | Source: Ambulatory Visit | Attending: Obstetrics and Gynecology | Admitting: Obstetrics and Gynecology

## 2011-08-27 LAB — COMPREHENSIVE METABOLIC PANEL
AST: 14 U/L (ref 0–37)
Albumin: 3.7 g/dL (ref 3.5–5.2)
Alkaline Phosphatase: 81 U/L (ref 39–117)
BUN: 13 mg/dL (ref 6–23)
CO2: 24 mEq/L (ref 19–32)
Calcium: 9.5 mg/dL (ref 8.4–10.5)
Creatinine, Ser: 0.63 mg/dL (ref 0.50–1.10)
Glucose, Bld: 98 mg/dL (ref 70–99)
Total Bilirubin: 0.4 mg/dL (ref 0.3–1.2)

## 2011-08-27 LAB — CBC
HCT: 35.3 % — ABNORMAL LOW (ref 36.0–46.0)
Hemoglobin: 11.6 g/dL — ABNORMAL LOW (ref 12.0–15.0)
MCH: 26.4 pg (ref 26.0–34.0)
MCHC: 32.9 g/dL (ref 30.0–36.0)
MCV: 80.4 fL (ref 78.0–100.0)
RBC: 4.39 MIL/uL (ref 3.87–5.11)

## 2011-08-27 LAB — SURGICAL PCR SCREEN: Staphylococcus aureus: NEGATIVE

## 2011-08-27 NOTE — Patient Instructions (Addendum)
20 Shelby Howard  08/27/2011   Your procedure is scheduled on:  09/06/11  Enter through the Main Entrance of [facility] at 1100 AM.  Pick up the phone at the desk and dial [phone].   Call this number if you have problems the morning of surgery: [phone]   Remember:   Do not eat food:After Midnight.  Do not drink clear liquids: 4 Hours before arrival.  Take these medicines the morning of surgery with A SIP OF WATER: Wellbutrin   Do not wear jewelry, make-up or nail polish.  Do not wear lotions, powders, or perfumes. You may wear deodorant.  Do not shave 48 hours prior to surgery.  Do not bring valuables to the hospital.  Contacts, dentures or bridgework may not be worn into surgery.  Leave suitcase in the car. After surgery it may be brought to your room.  For patients admitted to the hospital, checkout time is 11:00 AM the day of discharge.   Patients discharged the day of surgery will not be allowed to drive home.  Name and phone number of your driver: NA  Special Instructions: CHG Shower Use Special Wash: 1/2 bottle night before surgery and 1/2 bottle morning of surgery.   Please read over the following fact sheets that you were given: MRSA Information

## 2011-08-29 ENCOUNTER — Encounter: Payer: Self-pay | Admitting: Obstetrics and Gynecology

## 2011-08-29 ENCOUNTER — Ambulatory Visit (INDEPENDENT_AMBULATORY_CARE_PROVIDER_SITE_OTHER): Payer: BC Managed Care – PPO | Admitting: Obstetrics and Gynecology

## 2011-08-29 VITALS — BP 130/88 | HR 96 | Temp 99.0°F | Ht 67.0 in | Wt 268.0 lb

## 2011-08-29 DIAGNOSIS — D219 Benign neoplasm of connective and other soft tissue, unspecified: Secondary | ICD-10-CM

## 2011-08-29 DIAGNOSIS — D259 Leiomyoma of uterus, unspecified: Secondary | ICD-10-CM

## 2011-08-29 DIAGNOSIS — D069 Carcinoma in situ of cervix, unspecified: Secondary | ICD-10-CM

## 2011-08-29 NOTE — H&P (Signed)
Shelby Howard is here for pre-op appt. Hysterectomy scheduled 09/06/11.  Patient is a 40 y.o. gravida  Shelby Howard is an 40 y.o. female.  Shelby Howard having a hysterectomy for adenocarcinoma of the cervix and symptomatic fibroids  Pertinent Gynecological History: Menses: flow is excessive with use of 20-30 pads or tampons on heaviest days Bleeding: dysfunctional uterine bleeding Contraception: none DES exposure: unknown Blood transfusions: none Sexually transmitted diseases: HPV Previous GYN Procedures: DNC and CKC  Last mammogram: n/a Date:  Last pap: abnormal: see above Date:  OB History: G1, P1   Menstrual History: Menarche age: 14 Patient's last menstrual period was 08/07/2011.    Past Medical History  Diagnosis Date  . Fibroids   . Bipolar disorder   . Anxiety   . Depression   . Insomnia   . Anemia     history  . Endocervical adenocarcinoma     Past Surgical History  Procedure Date  . Dilation and curettage of uterus 09/2007    resection of polyp  . Svd 1998    x 1  . Cervical conization w/bx 03/02/2011    Procedure: CONIZATION CERVIX WITH BIOPSY;  Surgeon: Aliany Fiorenza A Talecia Sherlin, MD;  Location: [facility];  Service: Gynecology;  Laterality: N/A;  Cold Knife Conization, Endocervical Curretage    Family History  Problem Relation Age of Onset  . Hypertension Mother     Social History:  reports that she has never smoked. She has never used smokeless tobacco. She reports that she drinks about 2.4 ounces of alcohol per week. She reports that she does not use illicit drugs.  Allergies: No Known Allergies   (Not in a hospital admission)  ROS see above  Blood pressure 130/88, pulse 96, temperature 99 F (37.2 C), temperature source Oral, height 5' 7" (1.702 m), weight 268 lb (121.564 kg), last menstrual period 08/07/2011. Physical Exam Shelby Howard without complaints Physical Examination: General appearance - alert, well appearing, and in no distress Mental status - normal mood, behavior, speech,  dress, motor activity, and thought processes Neck - supple, no significant adenopathy, thyroid exam: thyroid is normal in size without nodules or tenderness Chest - clear to auscultation, no wheezes, rales or rhonchi, symmetric air entry Heart - normal rate and regular rhythm Abdomen - soft, nontender, nondistended, no masses or organomegaly Pelvic - normal external genitalia, vulva, vagina, cervix, uterus and adnexa Rectal - normal rectal, no masses, rectal exam not indicated Back exam - full range of motion, no tenderness, palpable spasm or pain on motion Neurological - alert, oriented, normal speech, no focal findings or movement disorder noted Musculoskeletal - no joint tenderness, deformity or swelling Extremities - no edema, redness or tenderness in the calves or thighs Skin - normal coloration and turgor, no rashes, no suspicious skin lesions noted No results found for this or any previous visit (from the past 24 hour(s)).  No results found.  Assessment/Plan: Adenocarcinoma of the cervix and Fibroids Shelby Howard fot [facility] cystocopy possible [facility] R&B reviewed hysterectomy consent signed  Westyn Keatley A 08/29/2011, 9:41 AM  

## 2011-08-29 NOTE — Progress Notes (Signed)
Pt is here for pre-op appt. Hysterectomy scheduled 09/06/11.  Patient is a 41 y.o. gravida  Shelby Howard is an 41 y.o. female.  Pt having a hysterectomy for adenocarcinoma of the cervix and symptomatic fibroids  Pertinent Gynecological History: Menses: flow is excessive with use of 20-30 pads or tampons on heaviest days Bleeding: dysfunctional uterine bleeding Contraception: none DES exposure: unknown Blood transfusions: none Sexually transmitted diseases: HPV Previous GYN Procedures: DNC and CKC  Last mammogram: n/a Date:  Last pap: abnormal: see above Date:  OB History: G1, P1   Menstrual History: Menarche age: 20 Patient's last menstrual period was 08/07/2011.    Past Medical History  Diagnosis Date  . Fibroids   . Bipolar disorder   . Anxiety   . Depression   . Insomnia   . Anemia     history  . Endocervical adenocarcinoma     Past Surgical History  Procedure Date  . Dilation and curettage of uterus 09/2007    resection of polyp  . Svd 1998    x 1  . Cervical conization w/bx 03/02/2011    Procedure: CONIZATION CERVIX WITH BIOPSY;  Surgeon: Michael Litter, MD;  Location: [facility];  Service: Gynecology;  Laterality: N/A;  Cold Knife Conization, Endocervical Curretage    Family History  Problem Relation Age of Onset  . Hypertension Mother     Social History:  reports that she has never smoked. She has never used smokeless tobacco. She reports that she drinks about 2.4 ounces of alcohol per week. She reports that she does not use illicit drugs.  Allergies: No Known Allergies   (Not in a hospital admission)  ROS see above  Blood pressure 130/88, pulse 96, temperature 99 F (37.2 C), temperature source Oral, height 5\' 7"  (1.702 m), weight 268 lb (121.564 kg), last menstrual period 08/07/2011. Physical Exam Pt without complaints Physical Examination: General appearance - alert, well appearing, and in no distress Mental status - normal mood, behavior, speech,  dress, motor activity, and thought processes Neck - supple, no significant adenopathy, thyroid exam: thyroid is normal in size without nodules or tenderness Chest - clear to auscultation, no wheezes, rales or rhonchi, symmetric air entry Heart - normal rate and regular rhythm Abdomen - soft, nontender, nondistended, no masses or organomegaly Pelvic - normal external genitalia, vulva, vagina, cervix, uterus and adnexa Rectal - normal rectal, no masses, rectal exam not indicated Back exam - full range of motion, no tenderness, palpable spasm or pain on motion Neurological - alert, oriented, normal speech, no focal findings or movement disorder noted Musculoskeletal - no joint tenderness, deformity or swelling Extremities - no edema, redness or tenderness in the calves or thighs Skin - normal coloration and turgor, no rashes, no suspicious skin lesions noted No results found for this or any previous visit (from the past 24 hour(s)).  No results found.  Assessment/Plan: Adenocarcinoma of the cervix and Fibroids Pt fot [facility] cystocopy possible [facility] R&B reviewed hysterectomy consent signed  Shelby Howard A 08/29/2011, 9:41 AM

## 2011-08-29 NOTE — Patient Instructions (Signed)
Pt given bowel prep Hysterectomy Information  A hysterectomy is a procedure where your uterus is surgically removed. It will no longer be possible to have menstrual periods or to become pregnant. The tubes and ovaries can be removed (bilateral salpingo-oopherectomy) during this surgery as well.  REASONS FOR A HYSTERECTOMY  Persistent, abnormal bleeding.   Lasting (chronic) pelvic pain or infection.   The lining of the uterus (endometrium) starts growing outside the uterus (endometriosis).   The endometrium starts growing in the muscle of the uterus (adenomyosis).   The uterus falls down into the vagina (pelvic organ prolapse).   Symptomatic uterine fibroids.   Precancerous cells.   Cervical cancer or uterine cancer.  TYPES OF HYSTERECTOMIES  Supracervical hysterectomy. This type removes the top part of the uterus, but not the cervix.   Total hysterectomy. This type removes the uterus and cervix.   Radical hysterectomy. This type removes the uterus, cervix, and the fibrous tissue that holds the uterus in place in the pelvis (parametrium).  WAYS A HYSTERECTOMY CAN BE PERFORMED  Abdominal hysterectomy. A large surgical cut (incision) is made in the abdomen. The uterus is removed through this incision.   Vaginal hysterectomy. An incision is made in the vagina. The uterus is removed through this incision. There are no abdominal incisions.   Conventional laparoscopic hysterectomy. A thin, lighted tube with a camera (laparoscope) is inserted into 3 or 4 small incisions in the abdomen. The uterus is cut into small pieces. The small pieces are removed through the incisions, or they are removed through the vagina.   Laparoscopic assisted vaginal hysterectomy (LAVH). Three or four small incisions are made in the abdomen. Part of the surgery is performed laparoscopically and part vaginally. The uterus is removed through the vagina.   Robot-assisted laparoscopic hysterectomy. A laparoscope is  inserted into 3 or 4 small incisions in the abdomen. A computer-controlled device is used to give the surgeon a 3D image. This allows for more precise movements of surgical instruments. The uterus is cut into small pieces and removed through the incisions or removed through the vagina.  RISKS OF HYSTERECTOMY   Bleeding and risk of blood transfusion. Tell your caregiver if you do not want to receive any blood products.   Blood clots in the legs or lung.   Infection.   Injury to surrounding organs.   Anesthesia problems or side effects.   Conversion to an abdominal hysterectomy.  WHAT TO EXPECT AFTER A HYSTERECTOMY  You will be given pain medicine.   You will need to have someone with you for the first 3 to 5 days after you go home.   You will need to follow up with your surgeon in 2 to 4 weeks after surgery to evaluate your progress.   You may have early menopause symptoms like hot flashes, night sweats, and insomnia.   If you had a hysterectomy for a problem that was not a cancer or a condition that could lead to cancer, then you no longer need Pap tests. However, even if you no longer need a Pap test, a regular exam is a good idea to make sure no other problems are starting.  Document Released: 09/05/2000 Document Revised: 03/01/2011 Document Reviewed: 10/21/2010 [facility] Patient Information 2012 [place], [place].

## 2011-09-05 MED ORDER — CEFAZOLIN SODIUM-DEXTROSE 2-3 GM-% IV SOLR
2.0000 g | INTRAVENOUS | Status: AC
  Administered 2011-09-06: 2 g via INTRAVENOUS
  Filled 2011-09-05: qty 50

## 2011-09-06 ENCOUNTER — Ambulatory Visit (HOSPITAL_COMMUNITY)
Admission: RE | Admit: 2011-09-06 | Discharge: 2011-09-07 | Disposition: A | Discharge status: Home / Self Care | Payer: BC Managed Care – PPO | Source: Ambulatory Visit | Attending: Obstetrics and Gynecology | Admitting: Obstetrics and Gynecology

## 2011-09-06 ENCOUNTER — Ambulatory Visit (HOSPITAL_COMMUNITY): Payer: BC Managed Care – PPO

## 2011-09-06 ENCOUNTER — Encounter (HOSPITAL_COMMUNITY): Payer: Self-pay | Admitting: *Deleted

## 2011-09-06 ENCOUNTER — Encounter (HOSPITAL_COMMUNITY): Payer: Self-pay

## 2011-09-06 ENCOUNTER — Encounter (HOSPITAL_COMMUNITY)
Admission: RE | Disposition: A | Discharge status: Home / Self Care | Payer: Self-pay | Source: Ambulatory Visit | Attending: Obstetrics and Gynecology

## 2011-09-06 DIAGNOSIS — D069 Carcinoma in situ of cervix, unspecified: Secondary | ICD-10-CM | POA: Insufficient documentation

## 2011-09-06 DIAGNOSIS — N938 Other specified abnormal uterine and vaginal bleeding: Secondary | ICD-10-CM | POA: Insufficient documentation

## 2011-09-06 DIAGNOSIS — Z01812 Encounter for preprocedural laboratory examination: Secondary | ICD-10-CM | POA: Insufficient documentation

## 2011-09-06 DIAGNOSIS — N949 Unspecified condition associated with female genital organs and menstrual cycle: Secondary | ICD-10-CM | POA: Insufficient documentation

## 2011-09-06 DIAGNOSIS — D259 Leiomyoma of uterus, unspecified: Secondary | ICD-10-CM | POA: Insufficient documentation

## 2011-09-06 DIAGNOSIS — Z01818 Encounter for other preprocedural examination: Secondary | ICD-10-CM | POA: Insufficient documentation

## 2011-09-06 HISTORY — PX: LAPAROSCOPIC HYSTERECTOMY: SHX1926

## 2011-09-06 HISTORY — PX: CYSTOSCOPY: SHX5120

## 2011-09-06 SURGERY — HYSTERECTOMY, TOTAL, LAPAROSCOPIC
Anesthesia: General | Site: Bladder | Wound class: Clean Contaminated

## 2011-09-06 MED ORDER — GLYCOPYRROLATE 0.2 MG/ML IJ SOLN
INTRAMUSCULAR | Status: AC
  Filled 2011-09-06: qty 1

## 2011-09-06 MED ORDER — BUPIVACAINE HCL (PF) 0.25 % IJ SOLN
INTRAMUSCULAR | Status: AC
  Filled 2011-09-06: qty 30

## 2011-09-06 MED ORDER — LAMOTRIGINE 100 MG PO TABS
200.0000 mg | ORAL_TABLET | Freq: Every day | ORAL | Status: DC
  Filled 2011-09-06 (×2): qty 2

## 2011-09-06 MED ORDER — ONDANSETRON HCL 4 MG/2ML IJ SOLN
INTRAMUSCULAR | Status: AC
  Filled 2011-09-06: qty 2

## 2011-09-06 MED ORDER — KETOROLAC TROMETHAMINE 30 MG/ML IJ SOLN
INTRAMUSCULAR | Status: AC
Start: 2011-09-06 — End: 2011-09-07
  Filled 2011-09-06: qty 1

## 2011-09-06 MED ORDER — HYDROMORPHONE HCL PF 1 MG/ML IJ SOLN
INTRAMUSCULAR | Status: AC
  Filled 2011-09-06: qty 1

## 2011-09-06 MED ORDER — HYDROMORPHONE HCL PF 1 MG/ML IJ SOLN
0.2500 mg | INTRAMUSCULAR | Status: DC | PRN
  Administered 2011-09-06 (×2): 0.5 mg via INTRAVENOUS

## 2011-09-06 MED ORDER — MENTHOL 3 MG MT LOZG: 1.0000 | LOZENGE | OROMUCOSAL | Status: DC | PRN

## 2011-09-06 MED ORDER — PROPOFOL 10 MG/ML IV EMUL
INTRAVENOUS | Status: DC | PRN
  Administered 2011-09-06: 50 mg via INTRAVENOUS
  Administered 2011-09-06: 200 mg via INTRAVENOUS

## 2011-09-06 MED ORDER — SUFENTANIL CITRATE 50 MCG/ML IV SOLN: INTRAVENOUS | Status: DC | PRN

## 2011-09-06 MED ORDER — PROPOFOL 10 MG/ML IV EMUL
INTRAVENOUS | Status: AC
  Filled 2011-09-06: qty 20

## 2011-09-06 MED ORDER — INDIGOTINDISULFONATE SODIUM 8 MG/ML IJ SOLN
INTRAMUSCULAR | Status: DC | PRN
  Administered 2011-09-06: 5 mL via INTRAVENOUS

## 2011-09-06 MED ORDER — NALOXONE HCL 0.4 MG/ML IJ SOLN: 0.4000 mg | INTRAMUSCULAR | Status: DC | PRN

## 2011-09-06 MED ORDER — DIPHENHYDRAMINE HCL 50 MG/ML IJ SOLN: 12.5000 mg | Freq: Four times a day (QID) | INTRAMUSCULAR | Status: DC | PRN

## 2011-09-06 MED ORDER — MIDAZOLAM HCL 2 MG/2ML IJ SOLN
INTRAMUSCULAR | Status: AC
  Filled 2011-09-06: qty 2

## 2011-09-06 MED ORDER — LIDOCAINE HCL (CARDIAC) 20 MG/ML IV SOLN
INTRAVENOUS | Status: DC | PRN
  Administered 2011-09-06: 100 mg via INTRAVENOUS

## 2011-09-06 MED ORDER — SUCCINYLCHOLINE CHLORIDE 20 MG/ML IJ SOLN
INTRAMUSCULAR | Status: AC
Start: 2011-09-06 — End: 2011-09-06
  Filled 2011-09-06: qty 10

## 2011-09-06 MED ORDER — STERILE WATER FOR IRRIGATION IR SOLN
Status: DC | PRN
  Administered 2011-09-06: 1000 mL

## 2011-09-06 MED ORDER — GLYCOPYRROLATE 0.2 MG/ML IJ SOLN
INTRAMUSCULAR | Status: DC | PRN
  Administered 2011-09-06: 0.4 mg via INTRAVENOUS

## 2011-09-06 MED ORDER — SODIUM CHLORIDE 0.9 % IJ SOLN: 9.0000 mL | INTRAMUSCULAR | Status: DC | PRN

## 2011-09-06 MED ORDER — ZOLPIDEM TARTRATE 5 MG PO TABS
5.0000 mg | ORAL_TABLET | Freq: Every evening | ORAL | Status: DC | PRN
  Filled 2011-09-06: qty 1

## 2011-09-06 MED ORDER — MIDAZOLAM HCL 5 MG/5ML IJ SOLN
INTRAMUSCULAR | Status: DC | PRN
  Administered 2011-09-06: 2 mg via INTRAVENOUS

## 2011-09-06 MED ORDER — INDIGOTINDISULFONATE SODIUM 8 MG/ML IJ SOLN
INTRAMUSCULAR | Status: AC
  Filled 2011-09-06: qty 5

## 2011-09-06 MED ORDER — KETOROLAC TROMETHAMINE 30 MG/ML IJ SOLN: 30.0000 mg | Freq: Four times a day (QID) | INTRAMUSCULAR | Status: DC | PRN

## 2011-09-06 MED ORDER — HYDROMORPHONE 0.3 MG/ML IV SOLN
INTRAVENOUS | Status: DC
  Administered 2011-09-06: 1.5 mg via INTRAVENOUS
  Administered 2011-09-06: 18:00:00 via INTRAVENOUS
  Administered 2011-09-07: 2.1 mg via INTRAVENOUS
  Administered 2011-09-07: 0.6 mg via INTRAVENOUS
  Administered 2011-09-07: 2.4 mg via INTRAVENOUS
  Filled 2011-09-06: qty 25

## 2011-09-06 MED ORDER — LACTATED RINGERS IR SOLN
Status: DC | PRN
  Administered 2011-09-06: 3000 mL

## 2011-09-06 MED ORDER — BUPIVACAINE HCL (PF) 0.25 % IJ SOLN
INTRAMUSCULAR | Status: DC | PRN
  Administered 2011-09-06: 10 mL

## 2011-09-06 MED ORDER — DEXAMETHASONE SODIUM PHOSPHATE 4 MG/ML IJ SOLN
INTRAMUSCULAR | Status: DC | PRN
  Administered 2011-09-06: 10 mg via INTRAVENOUS

## 2011-09-06 MED ORDER — LIDOCAINE HCL (CARDIAC) 20 MG/ML IV SOLN
INTRAVENOUS | Status: AC
  Filled 2011-09-06: qty 5

## 2011-09-06 MED ORDER — DIVALPROEX SODIUM 500 MG PO DR TAB
1000.0000 mg | DELAYED_RELEASE_TABLET | Freq: Every day | ORAL | Status: DC
  Filled 2011-09-06 (×2): qty 2

## 2011-09-06 MED ORDER — ROCURONIUM BROMIDE 50 MG/5ML IV SOLN
INTRAVENOUS | Status: AC
  Filled 2011-09-06: qty 1

## 2011-09-06 MED ORDER — NEOSTIGMINE METHYLSULFATE 1 MG/ML IJ SOLN
INTRAMUSCULAR | Status: DC | PRN
  Administered 2011-09-06: 3 mg via INTRAVENOUS

## 2011-09-06 MED ORDER — SUCCINYLCHOLINE CHLORIDE 20 MG/ML IJ SOLN
INTRAMUSCULAR | Status: DC | PRN
  Administered 2011-09-06: 120 mg via INTRAVENOUS

## 2011-09-06 MED ORDER — ROCURONIUM BROMIDE 100 MG/10ML IV SOLN
INTRAVENOUS | Status: DC | PRN
  Administered 2011-09-06: 5 mg via INTRAVENOUS
  Administered 2011-09-06: 10 mg via INTRAVENOUS
  Administered 2011-09-06: 35 mg via INTRAVENOUS
  Administered 2011-09-06 (×2): 10 mg via INTRAVENOUS
  Administered 2011-09-06: 5 mg via INTRAVENOUS

## 2011-09-06 MED ORDER — DEXAMETHASONE SODIUM PHOSPHATE 10 MG/ML IJ SOLN
INTRAMUSCULAR | Status: AC
  Filled 2011-09-06: qty 1

## 2011-09-06 MED ORDER — ONDANSETRON HCL 4 MG/2ML IJ SOLN
INTRAMUSCULAR | Status: DC | PRN
  Administered 2011-09-06: 4 mg via INTRAVENOUS

## 2011-09-06 MED ORDER — ALPRAZOLAM 0.25 MG PO TABS: 0.2500 mg | ORAL_TABLET | Freq: Two times a day (BID) | ORAL | Status: DC | PRN

## 2011-09-06 MED ORDER — LACTATED RINGERS IV SOLN
INTRAVENOUS | Status: DC
  Administered 2011-09-06 – 2011-09-07 (×2): via INTRAVENOUS

## 2011-09-06 MED ORDER — IBUPROFEN 600 MG PO TABS
600.0000 mg | ORAL_TABLET | Freq: Four times a day (QID) | ORAL | Status: DC | PRN
Start: 2011-09-06 — End: 2011-09-07
  Administered 2011-09-07: 600 mg via ORAL
  Filled 2011-09-06: qty 1

## 2011-09-06 MED ORDER — FENTANYL CITRATE 0.05 MG/ML IJ SOLN
INTRAMUSCULAR | Status: AC
  Filled 2011-09-06: qty 5

## 2011-09-06 MED ORDER — FENTANYL CITRATE 0.05 MG/ML IJ SOLN
INTRAMUSCULAR | Status: DC | PRN
  Administered 2011-09-06: 50 ug via INTRAVENOUS
  Administered 2011-09-06 (×2): 100 ug via INTRAVENOUS

## 2011-09-06 MED ORDER — KETOROLAC TROMETHAMINE 30 MG/ML IJ SOLN
15.0000 mg | Freq: Once | INTRAMUSCULAR | Status: AC | PRN
  Administered 2011-09-06: 30 mg via INTRAVENOUS

## 2011-09-06 MED ORDER — DIPHENHYDRAMINE HCL 12.5 MG/5ML PO ELIX
12.5000 mg | ORAL_SOLUTION | Freq: Four times a day (QID) | ORAL | Status: DC | PRN
  Filled 2011-09-06: qty 5

## 2011-09-06 MED ORDER — ONDANSETRON HCL 4 MG/2ML IJ SOLN: 4.0000 mg | Freq: Four times a day (QID) | INTRAMUSCULAR | Status: DC | PRN

## 2011-09-06 MED ORDER — HYDROMORPHONE HCL PF 1 MG/ML IJ SOLN
INTRAMUSCULAR | Status: DC | PRN
  Administered 2011-09-06 (×2): 1 mg via INTRAVENOUS

## 2011-09-06 MED ORDER — BUPROPION HCL ER (XL) 300 MG PO TB24
300.0000 mg | ORAL_TABLET | Freq: Every day | ORAL | Status: DC
  Filled 2011-09-06 (×2): qty 1

## 2011-09-06 MED ORDER — LACTATED RINGERS IV SOLN
INTRAVENOUS | Status: DC
  Administered 2011-09-06 (×4): via INTRAVENOUS

## 2011-09-06 MED ORDER — HYDROCODONE-ACETAMINOPHEN 5-325 MG PO TABS
1.0000 | ORAL_TABLET | ORAL | Status: DC | PRN
  Administered 2011-09-07 (×3): 2 via ORAL
  Filled 2011-09-06 (×3): qty 2

## 2011-09-06 SURGICAL SUPPLY — 45 items
BARRIER ADHS 3X4 INTERCEED (GAUZE/BANDAGES/DRESSINGS) IMPLANT
CHLORAPREP W/TINT 26ML (MISCELLANEOUS) ×3 IMPLANT
CLOTH BEACON ORANGE TIMEOUT ST (SAFETY) ×3 IMPLANT
COVER MAYO STAND STRL (DRAPES) ×3 IMPLANT
COVER TABLE BACK 60X90 (DRAPES) ×3 IMPLANT
DERMABOND ADVANCED (GAUZE/BANDAGES/DRESSINGS) ×1
DERMABOND ADVANCED .7 DNX12 (GAUZE/BANDAGES/DRESSINGS) ×2 IMPLANT
DISSECTOR BLUNT TIP ENDO 5MM (MISCELLANEOUS) IMPLANT
DRAPE HYSTEROSCOPY (DRAPE) ×3 IMPLANT
EVACUATOR SMOKE 8.L (FILTER) ×6 IMPLANT
GLOVE BIO SURGEON STRL SZ 6.5 (GLOVE) ×6 IMPLANT
GLOVE BIOGEL PI IND STRL 7.0 (GLOVE) ×2 IMPLANT
GLOVE BIOGEL PI IND STRL 7.5 (GLOVE) ×2 IMPLANT
GLOVE BIOGEL PI INDICATOR 7.0 (GLOVE) ×1
GLOVE BIOGEL PI INDICATOR 7.5 (GLOVE) ×1
GOWN PREVENTION PLUS LG XLONG (DISPOSABLE) ×6 IMPLANT
HEMOSTAT SURGICEL 2X14 (HEMOSTASIS) IMPLANT
NS IRRIG 1000ML POUR BTL (IV SOLUTION) ×3 IMPLANT
OCCLUDER COLPOPNEUMO (BALLOONS) ×3 IMPLANT
PACK LAPAROSCOPY BASIN (CUSTOM PROCEDURE TRAY) ×3 IMPLANT
SCALPEL HARMONIC ACE (MISCELLANEOUS) ×3 IMPLANT
SCISSORS LAP 5X35 DISP (ENDOMECHANICALS) IMPLANT
SET CYSTO W/LG BORE CLAMP LF (SET/KITS/TRAYS/PACK) ×3 IMPLANT
SET IRRIG TUBING LAPAROSCOPIC (IRRIGATION / IRRIGATOR) ×3 IMPLANT
SLEEVE ADV FIXATION 5X100MM (TROCAR) ×3 IMPLANT
SOLUTION ELECTROLUBE (MISCELLANEOUS) ×3 IMPLANT
SPONGE SURGIFOAM ABS GEL 12-7 (HEMOSTASIS) ×3 IMPLANT
STRIP CLOSURE SKIN 1/4X4 (GAUZE/BANDAGES/DRESSINGS) IMPLANT
SUT MNCRL AB 3-0 PS2 27 (SUTURE) ×3 IMPLANT
SUT PDS AB 1 CT1 36 (SUTURE) IMPLANT
SUT VICRYL 0 TIES 12 18 (SUTURE) IMPLANT
SUT VICRYL 0 UR6 27IN ABS (SUTURE) ×3 IMPLANT
SYR 50ML LL SCALE MARK (SYRINGE) ×3 IMPLANT
TIP UTERINE 5.1X6CM LAV DISP (MISCELLANEOUS) IMPLANT
TIP UTERINE 6.7X10CM GRN DISP (MISCELLANEOUS) IMPLANT
TIP UTERINE 6.7X6CM WHT DISP (MISCELLANEOUS) IMPLANT
TIP UTERINE 6.7X8CM BLUE DISP (MISCELLANEOUS) ×3 IMPLANT
TOWEL OR 17X24 6PK STRL BLUE (TOWEL DISPOSABLE) ×6 IMPLANT
TRAY FOLEY CATH 14FR (SET/KITS/TRAYS/PACK) ×3 IMPLANT
TROCAR BALLN 12MMX100 BLUNT (TROCAR) IMPLANT
TROCAR Z-THREAD FIOS 11X100 BL (TROCAR) ×3 IMPLANT
TROCAR Z-THREAD FIOS 5X100MM (TROCAR) ×3 IMPLANT
TUBING FILTER THERMOFLATOR (ELECTROSURGICAL) ×3 IMPLANT
WARMER LAPAROSCOPE (MISCELLANEOUS) ×3 IMPLANT
WATER STERILE IRR 1000ML POUR (IV SOLUTION) ×3 IMPLANT

## 2011-09-06 NOTE — Anesthesia Postprocedure Evaluation (Signed)
  Anesthesia Post-op Note  Patient: Shelby Howard  Procedure(s) Performed: Procedure(s) (LRB): HYSTERECTOMY TOTAL LAPAROSCOPIC (N/A) CYSTOSCOPY (N/A) Patient is awake and responsive. Pain and nausea are reasonably well controlled. Vital signs are stable and clinically acceptable. Oxygen saturation is clinically acceptable. There are no apparent anesthetic complications at this time. Patient is ready for discharge.

## 2011-09-06 NOTE — H&P (View-Only) (Signed)
Pt is here for pre-op appt. Hysterectomy scheduled 09/06/11.  Patient is a 41 y.o. gravida  Shelby Howard is an 41 y.o. female.  Pt having a hysterectomy for adenocarcinoma of the cervix and symptomatic fibroids  Pertinent Gynecological History: Menses: flow is excessive with use of 20-30 pads or tampons on heaviest days Bleeding: dysfunctional uterine bleeding Contraception: none DES exposure: unknown Blood transfusions: none Sexually transmitted diseases: HPV Previous GYN Procedures: DNC and CKC  Last mammogram: n/a Date:  Last pap: abnormal: see above Date:  OB History: G1, P1   Menstrual History: Menarche age: 14 Patient's last menstrual period was 08/07/2011.    Past Medical History  Diagnosis Date  . Fibroids   . Bipolar disorder   . Anxiety   . Depression   . Insomnia   . Anemia     history  . Endocervical adenocarcinoma     Past Surgical History  Procedure Date  . Dilation and curettage of uterus 09/2007    resection of polyp  . Svd 1998    x 1  . Cervical conization w/bx 03/02/2011    Procedure: CONIZATION CERVIX WITH BIOPSY;  Surgeon: Ruthe Roemer A Demetrice Combes, MD;  Location: [facility];  Service: Gynecology;  Laterality: N/A;  Cold Knife Conization, Endocervical Curretage    Family History  Problem Relation Age of Onset  . Hypertension Mother     Social History:  reports that she has never smoked. She has never used smokeless tobacco. She reports that she drinks about 2.4 ounces of alcohol per week. She reports that she does not use illicit drugs.  Allergies: No Known Allergies   (Not in a hospital admission)  ROS see above  Blood pressure 130/88, pulse 96, temperature 99 F (37.2 C), temperature source Oral, height 5' 7" (1.702 m), weight 268 lb (121.564 kg), last menstrual period 08/07/2011. Physical Exam Pt without complaints Physical Examination: General appearance - alert, well appearing, and in no distress Mental status - normal mood, behavior, speech,  dress, motor activity, and thought processes Neck - supple, no significant adenopathy, thyroid exam: thyroid is normal in size without nodules or tenderness Chest - clear to auscultation, no wheezes, rales or rhonchi, symmetric air entry Heart - normal rate and regular rhythm Abdomen - soft, nontender, nondistended, no masses or organomegaly Pelvic - normal external genitalia, vulva, vagina, cervix, uterus and adnexa Rectal - normal rectal, no masses, rectal exam not indicated Back exam - full range of motion, no tenderness, palpable spasm or pain on motion Neurological - alert, oriented, normal speech, no focal findings or movement disorder noted Musculoskeletal - no joint tenderness, deformity or swelling Extremities - no edema, redness or tenderness in the calves or thighs Skin - normal coloration and turgor, no rashes, no suspicious skin lesions noted No results found for this or any previous visit (from the past 24 hour(s)).  No results found.  Assessment/Plan: Adenocarcinoma of the cervix and Fibroids Pt fot [facility] cystocopy possible [facility] R&B reviewed hysterectomy consent signed  Sacha Topor A 08/29/2011, 9:41 AM  

## 2011-09-06 NOTE — Progress Notes (Signed)
Patient ID: Mattilynn Forrer, female   DOB: [birth year], 41 y.o.   MRN: [id number] Day of Surgery Procedure(s): HYSTERECTOMY TOTAL LAPAROSCOPIC CYSTOSCOPY  Subjective: Patient reports incisional pain.    Objective: BP 130/83  Pulse 72  Temp 97.7 F (36.5 C) (Oral)  Resp 18  Ht 5\' 7"  (1.702 m)  Wt 268 lb (121.564 kg)  BMI 41.97 kg/m2  SpO2 96%  LMP 08/07/2011  General: alert Resp: clear to auscultation bilaterally Cardio: regular rate and rhythm GI: soft, non-tender; bowel sounds normal; no masses,  no organomegaly Vaginal Bleeding: minimal  Assessment: s/p Procedure(s): HYSTERECTOMY TOTAL LAPAROSCOPIC CYSTOSCOPY: stable and progressing well  Plan: Advance diet Encourage ambulation Discontinue IV fluids  LOS: 0 days    Rayshaun Needle A 09/06/2011, 10:20 PM

## 2011-09-06 NOTE — Interval H&P Note (Signed)
History and Physical Interval Note:  09/06/2011 12:59 PM  Shelby Howard  has presented today for surgery, with the diagnosis of CIS; Fibroids  The various methods of treatment have been discussed with the patient and family. After consideration of risks, benefits and other options for treatment, the patient has consented to  Procedure(s) (LRB): HYSTERECTOMY TOTAL LAPAROSCOPIC (N/A) CYSTOSCOPY (Bilateral) as a surgical intervention .  The patients' history has been reviewed, patient examined, no change in status, stable for surgery.  I have reviewed the patients' chart and labs.  Questions were answered to the patient's satisfaction.     ZOXWRUE,AVWUJ A  Date of Initial H&P: 08/29/11  History reviewed, patient examined, no change in status, stable for surgery.

## 2011-09-06 NOTE — Anesthesia Preprocedure Evaluation (Addendum)
Anesthesia Evaluation  Patient identified by MRN, date of birth, ID band Patient awake    Reviewed: Allergy & Precautions, H&P , NPO status , Patient's Chart, lab work & pertinent test results, reviewed documented beta blocker date and time   History of Anesthesia Complications Negative for: history of anesthetic complications  Airway Mallampati: III TM Distance: >3 FB Neck ROM: full    Dental  (+) Teeth Intact   Pulmonary neg pulmonary ROS,  breath sounds clear to auscultation  Pulmonary exam normal       Cardiovascular Exercise Tolerance: Good negative cardio ROS  Rhythm:regular Rate:Normal     Neuro/Psych PSYCHIATRIC DISORDERS (bipolar d/o, depression, anxiety, insomnia - noncompliant with meds) negative neurological ROS     GI/Hepatic negative GI ROS, (+)     Substance abuse: wine - "a couple of bottles a week"  alcohol use,   Endo/Other  Morbid obesity  Renal/GU negative Renal ROS  Female GU complaint     Musculoskeletal   Abdominal   Peds  Hematology negative hematology ROS (+)   Anesthesia Other Findings   Reproductive/Obstetrics negative OB ROS                          Anesthesia Physical Anesthesia Plan  ASA: III  Anesthesia Plan: General ETT   Post-op Pain Management:    Induction:   Airway Management Planned:   Additional Equipment:   Intra-op Plan:   Post-operative Plan:   Informed Consent: I have reviewed the patients History and Physical, chart, labs and discussed the procedure including the risks, benefits and alternatives for the proposed anesthesia with the patient or authorized representative who has indicated his/her understanding and acceptance.   Dental Advisory Given  Plan Discussed with: CRNA and Surgeon  Anesthesia Plan Comments:         Anesthesia Quick Evaluation

## 2011-09-06 NOTE — Op Note (Signed)
Preop Diagnosis: Carcinoma In Situ; Fibroids   Postop Diagnosis: Carcinoma In Situ   Procedure: HYSTERECTOMY TOTAL LAPAROSCOPIC CYSTOSCOPY   Anesthesia: General   Anesthesiologist: Dana Allan, MD   Attending: Michael Litter, MD   Assistant: Osborn Coho MD  Findings:small uterus with normal appearing tubes, ovaries and abdominal anatomy  Pathology: uterus and cervix  Fluids:  UOP: clear urine  EBL: 75 cc Complications: none  Procedure: The patient was taken to the operating room, placed under general anesthesia and prepped and draped in the normal sterile fashion. A Foley catheter was placed. The uterus sounded to 9cm. A weighted speculum and vaginal retractors were placed in the vagina. Tenaculum was placed on the anterior lip of the cervix.  A size 3.5cm tip was used and the rumi was placed, tip balloon and occluder insufflated. Attention was then turned to the abdomen. A 10 mm infraumbilical incision was made with the scalpel after 5 cc of 25% percent Marcaine was used for local anesthesia. The subcutaneous tissue was dissected and the fascia was incised with the knife. A purse string stitch was placed in the fascia and Hassan placed into the intra-abdominal cavity and anchored to the suture. Intraabdominal placement was confirmed with the laparoscope.  Two 5 mm trochars were placed in the right and left lower quadrants under direct visualization with the laparoscope.  The harmonic scalpel was used to cauterize and cut the uterine ovarian ligaments bilaterally.   Both round ligaments were cauterized and cut with the harmonic scalpel as well and the bladder flap created with the harmonic scalpel and removed away from the uterus. Both uterine arteries were cauterized and cut with harmonic scalpel.  The harmonic was used to circumscribe the [facility] ring and free the uterus and cervix. The uterus was then pulled into the vagina. Both angles were sutured with 1 PDS.  The   remainder of the cuff was sutured with 1 and 0 PDS using interrupted stitches until the vaginal cuff was closed.  A total of 6 sutures were used.   Irrigation was performed.  Gas was allowed to leave the abdomen to check for bleeders.  Along the posterior vaginal cuff a small amount of bleeding was seen.  This was made hemostatic with the kleppinger.    all pedicles were seen to be hemostatic the patient was given indigo carmine.  Cystoscopy was performed and both ureters were seen to efflux indigo carmine without difficulty. The bladder had full integrity with no suture or laceration visualized.  The vagina was inspected and the cuff was noted to be intact.  Attention was then turned back to the abdomen after removing top pair of gloves. The abdomen was reinsufflated with CO2 gas.   The abdomen and pelvis was copiously irrigated and  hemostasis was noted.  The 10 mm port on the patients left side was closed with 0 Vicryl using the fascial closure device.  All trochars were removed under direct visualization using the laparoscope.  The umbilical fascia was reapproximated by tying the circumferential suture.the fascia was still separated and a figure of eight 0 vicryl stitich was used to close the opening.   The two 10 mm incisions were closed with 3-0 Monocryl via a subcuticular stitch.  All remaining skin incisions were closed with Dermabond and the 10 mm skin incisions were reinforced using Dermabond.  Sponge lap and needle counts were correct.  The patient tolerated the procedure well and was returned to the PACU in stable condition

## 2011-09-06 NOTE — OR Nursing (Signed)
surgifoam placed inside abdomen for small area of bleeding

## 2011-09-06 NOTE — Transfer of Care (Signed)
Immediate Anesthesia Transfer of Care Note  Patient: Shelby Howard  Procedure(s) Performed: Procedure(s) (LRB): HYSTERECTOMY TOTAL LAPAROSCOPIC (N/A) CYSTOSCOPY (N/A)  Patient Location: PACU  Anesthesia Type: General  Level of Consciousness: awake, alert  and oriented  Airway & Oxygen Therapy: Patient Spontanous Breathing and Patient connected to nasal cannula oxygen  Post-op Assessment: Report given to PACU RN and Post -op Vital signs reviewed and stable  Post vital signs: stable  Complications: No apparent anesthesia complications

## 2011-09-07 ENCOUNTER — Encounter (HOSPITAL_COMMUNITY): Payer: Self-pay | Admitting: Obstetrics and Gynecology

## 2011-09-07 DIAGNOSIS — D259 Leiomyoma of uterus, unspecified: Secondary | ICD-10-CM

## 2011-09-07 DIAGNOSIS — C530 Malignant neoplasm of endocervix: Secondary | ICD-10-CM

## 2011-09-07 DIAGNOSIS — D069 Carcinoma in situ of cervix, unspecified: Secondary | ICD-10-CM

## 2011-09-07 LAB — BASIC METABOLIC PANEL
BUN: 4 mg/dL — ABNORMAL LOW (ref 6–23)
Calcium: 9 mg/dL (ref 8.4–10.5)
Creatinine, Ser: 0.58 mg/dL (ref 0.50–1.10)
GFR calc Af Amer: >90 mL/min (ref >90)
GFR calc non Af Amer: >90 mL/min (ref >90)
Potassium: 3.9 mEq/L (ref 3.5–5.1)

## 2011-09-07 LAB — CBC
HCT: 32.4 % — ABNORMAL LOW (ref 36.0–46.0)
MCH: 26.3 pg (ref 26.0–34.0)
MCHC: 32.4 g/dL (ref 30.0–36.0)
MCV: 81 fL (ref 78.0–100.0)
RDW: 16.3 % — ABNORMAL HIGH (ref 11.5–15.5)

## 2011-09-07 MED ORDER — HYDROCODONE-ACETAMINOPHEN 5-325 MG PO TABS: 1.0000 | ORAL_TABLET | ORAL | Status: AC | PRN

## 2011-09-07 MED ORDER — OXYCODONE-ACETAMINOPHEN 5-325 MG PO TABS: 1.0000 | ORAL_TABLET | Freq: Four times a day (QID) | ORAL | Status: DC | PRN

## 2011-09-07 MED ORDER — OXYCODONE-ACETAMINOPHEN 5-325 MG PO TABS: ORAL_TABLET | ORAL | Status: DC

## 2011-09-07 MED ORDER — ONDANSETRON HCL 4 MG PO TABS: 4.0000 mg | ORAL_TABLET | Freq: Three times a day (TID) | ORAL | Status: AC | PRN

## 2011-09-07 NOTE — Progress Notes (Signed)
Shelby Howard is a40 y.o.  [id number]  Post Op Date # 1  Subjective: Patient is Doing well postoperatively. Patient has good pain control with current analgesia, tolerating crackers and liquids. Denies nausea. Patient has not voided yet and hasn't passed flatus. Has ambulated in the hall without difficulty until she looked down and became somewhat lightheaded.  Objective: Vital signs in last 24 hours: Temp:  [97.5 F (36.4 C)-98.4 F (36.9 C)] 97.5 F (36.4 C) (06/14 0554) Pulse Rate:  [66-96] 66  (06/14 0554) Resp:  [15-20] 18  (06/14 0554) BP: (123-147)/(68-92) 128/80 mmHg (06/14 0554) SpO2:  [96 %-100 %] 99 % (06/14 0555) Weight:  [268 lb (121.564 kg)] 268 lb (121.564 kg) (06/13 1743)  Intake/Output from previous day: 06/13 0701 - 06/14 0700 In: 4798.3 [P.O.:240; I.V.:4558.3] Out: 2650 [Urine:2575] Intake/Output this shift:    Lab 09/07/11 0520  WBC 11.4*  HGB 10.5*  HCT 32.4*  PLT 260     Lab 09/07/11 0520  NA 133*  K 3.9  CL 100  CO2 27  BUN 4*  CREATININE 0.58  CALCIUM 9.0  PROT --  BILITOT --  ALKPHOS --  ALT --  AST --  GLUCOSE 106*    EXAM: General: alert, cooperative and no distress Resp: clear to auscultation bilaterally Cardio: regular rate and rhythm, S1, S2 normal, no murmur, click, rub or gallop GI: soft, appropriately tender; incisons intact without evidence of infection Extremities: Homans sign is negative, no sign of DVT and SCD hose in place and functioning Vaginal Bleeding: minimal   Assessment: s/p Procedure(s): HYSTERECTOMY TOTAL LAPAROSCOPIC CYSTOSCOPY: stable, progressing well and anemia  Plan: Advance diet Encourage ambulation Advance to PO medication Probable discharge home later today  LOS: 1 day    Luise Yamamoto, PA-C 09/07/2011 7:34 AM

## 2011-09-07 NOTE — Progress Notes (Signed)
12:20 - Pt c/o pain unrelieved by Vicodin and Motrin given at 9:30am.  Pt requesting call to MD to ask about different pain medicine. Phoned MD at 12:24 (left voice mail), 12:41, and 12:58.  Dr. Normand Sloop phoned back and stated pt could have prescription for Percocet for home and that pt could be discharged to home.  Dr. Normand Sloop had CNM in hospital write Percocet prescription.  13:40 - Percocet prescription ready.  Previously written Vicodin prescription destroyed with T. Corbitt, RN as witness.  Pt requested another dose of Vicodin before leaving as Percocet not ordered for in hospital.  Vicodin 2 tablets given.  14:00 - Pt discharged to home with mother.  Condition stable.  Pt ambulated to car with RN.  No equipment ordered for home at discharge.

## 2011-09-07 NOTE — Discharge Summary (Signed)
  Physician Discharge Summary  Patient ID: Shelby Howard MRN: [id number] DOB/AGE: 39-Jul-1972 39 y.o.  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 *    Operation: Total Laparoscopic Hysterectomy with Cystoscopy   Discharged Condition: Stable  Hospital Course: On the date of admission, the patient underwent the aforementioned procedures and tolerated them well.  Post operative course was unremarkable with patient resuming bowel and bladder function by post operative day #1 and therefore deemed ready for discharge home.   Disposition: 01-Home or Self Care  Discharge Medications:   Courtlyn, Aki  Home Medication Instructions [id number]   Printed on:09/07/11 0800  Medication Information                    ALPRAZolam (XANAX) 0.5 MG tablet Take 0.25-0.5 mg by mouth as needed. For Anxiety            zaleplon (SONATA) 10 MG capsule Take 10 mg by mouth at bedtime as needed. For Sleep.            buPROPion (WELLBUTRIN XL) 300 MG 24 hr tablet Take 300 mg by mouth every morning.             divalproex (DEPAKOTE) 500 MG DR tablet Take 1,000 mg by mouth at bedtime.             zolpidem (AMBIEN) 10 MG tablet Take 10 mg by mouth at bedtime as needed.             lamoTRIgine (LAMICTAL) 200 MG tablet Take 200 mg by mouth at bedtime.             ibuprofen (ADVIL,MOTRIN) 600 MG tablet Take 600 mg by mouth every 6 (six) hours as needed. For headache/Cramps           HYDROcodone-acetaminophen (NORCO) 5-325 MG per tablet Take 1-2 tablets by mouth every 4 (four) hours as needed.           ondansetron (ZOFRAN) 4 MG tablet Take 1 tablet (4 mg total) by mouth every 8 (eight) hours as needed for nausea.              Follow-up: Call to schedule an appointment with Dr. Normand Sloop in 6 weeks.   SignedHenreitta Leber, PA-C           09/07/2011, 8:00 AM

## 2011-09-07 NOTE — Progress Notes (Signed)
Per TC from Dr. Kandice Moos Percocet 5/325 mg 1-2 po q 6 hours prn pain. Rx to patient at discharge.  Nigel Bridgeman, CNM, [place] 09/07/11 1:15p

## 2011-09-07 NOTE — Anesthesia Postprocedure Evaluation (Signed)
  Anesthesia Post-op Note  Patient: Shelby Howard  Procedure(s) Performed: Procedure(s) (LRB): HYSTERECTOMY TOTAL LAPAROSCOPIC (N/A) CYSTOSCOPY (N/A)  Patient Location: [facility]  Anesthesia Type: General  Level of Consciousness: awake, alert  and oriented  Airway and Oxygen Therapy: Patient Spontanous Breathing  Post-op Pain: none  Post-op Assessment: Post-op Vital signs reviewed and Patient's Cardiovascular Status Stable  Post-op Vital Signs: Reviewed and stable  Complications: No apparent anesthesia complications

## 2011-09-07 NOTE — Discharge Instructions (Signed)
Call [place], [phone] for:  Post operative appointment time and date Temperature greater than or equal to 100.4 degrees Farenheit orally Excessive pain not managed with your pain medications Excessive bleeding, problems urinating or other concerns   While taking pain medications, take Colace (Docusate Sodium) 100 mg 2-3 times daily until bowel movements are regular to prevent constipation. You may take over the counter iron supplements twice daily for 6 weeks to help restore your iron levels and/or increase your iron rich foods  You may shower post-operative day #1 You may walk up stairs You may drive after 2  weeks You may lift items greater than 20 pounds after  6 weeks You should not resume sexual activity until after 6  weeks  You may resume a regular diet

## 2011-09-07 NOTE — Addendum Note (Signed)
Addendum  created 09/07/11 0801 by Shanon Payor, CRNA   Modules edited:Notes Section

## 2011-09-10 ENCOUNTER — Telehealth: Payer: Self-pay | Admitting: Obstetrics and Gynecology

## 2011-09-10 NOTE — Telephone Encounter (Signed)
Spoke with pt rgd msg pt had hyst on 09/06/11 with ND pt states wants refill on percocet has 10 left and wants refill still having pain advised pt need appt for eval if refill is needed pt will cal back to schd appt have to consult with her mother to see when she can bring her

## 2011-09-10 NOTE — Telephone Encounter (Signed)
Niccole/epic/ND pt

## 2011-09-14 ENCOUNTER — Ambulatory Visit (INDEPENDENT_AMBULATORY_CARE_PROVIDER_SITE_OTHER): Payer: BC Managed Care – PPO | Admitting: Obstetrics and Gynecology

## 2011-09-14 ENCOUNTER — Encounter: Payer: Self-pay | Admitting: Obstetrics and Gynecology

## 2011-09-14 VITALS — BP 122/80 | Temp 97.8°F | Ht 67.0 in | Wt 262.0 lb

## 2011-09-14 DIAGNOSIS — F419 Anxiety disorder, unspecified: Secondary | ICD-10-CM | POA: Insufficient documentation

## 2011-09-14 DIAGNOSIS — N949 Unspecified condition associated with female genital organs and menstrual cycle: Secondary | ICD-10-CM

## 2011-09-14 DIAGNOSIS — F319 Bipolar disorder, unspecified: Secondary | ICD-10-CM

## 2011-09-14 DIAGNOSIS — D219 Benign neoplasm of connective and other soft tissue, unspecified: Secondary | ICD-10-CM

## 2011-09-14 DIAGNOSIS — F329 Major depressive disorder, single episode, unspecified: Secondary | ICD-10-CM | POA: Insufficient documentation

## 2011-09-14 DIAGNOSIS — N390 Urinary tract infection, site not specified: Secondary | ICD-10-CM

## 2011-09-14 DIAGNOSIS — R102 Pelvic and perineal pain: Secondary | ICD-10-CM

## 2011-09-14 DIAGNOSIS — F411 Generalized anxiety disorder: Secondary | ICD-10-CM

## 2011-09-14 DIAGNOSIS — D259 Leiomyoma of uterus, unspecified: Secondary | ICD-10-CM

## 2011-09-14 DIAGNOSIS — C530 Malignant neoplasm of endocervix: Secondary | ICD-10-CM | POA: Insufficient documentation

## 2011-09-14 DIAGNOSIS — G4700 Insomnia, unspecified: Secondary | ICD-10-CM

## 2011-09-14 LAB — POCT URINALYSIS DIPSTICK
Bilirubin, UA: NEGATIVE
Blood, UA: >3
Glucose, UA: NEGATIVE
Nitrite, UA: NEGATIVE
Spec Grav, UA: 1.015
pH, UA: 5

## 2011-09-14 MED ORDER — OXYCODONE-ACETAMINOPHEN 5-325 MG PO TABS: 1.0000 | ORAL_TABLET | ORAL | Status: AC | PRN

## 2011-09-14 MED ORDER — CIPROFLOXACIN HCL 500 MG PO TABS: 500.0000 mg | ORAL_TABLET | Freq: Two times a day (BID) | ORAL | Status: AC

## 2011-09-14 NOTE — Progress Notes (Signed)
40 YO S/P TLH 09/06/11 complains of continued pelvic pain since her surgery that has been constant and awakens her from sleep (once her Percocet wears off):Marland Kitchen Ranges from sharp> dull & achy.  Admits to nausea but no vomiting, bowel function  basically normal, denies fever and back pain, but admits to dysuria.  O:  U/A=  SG-1.015, pH-5.0, leuk/blood->3+  Pelvic: EGBUS-wnl, vagina with copious yellow discharge, vaginal cuff difficult to evaluate due to patient discomfort but appears intact, palpation of cuff reveals questionable hematoma   A: UTI     Pelvic Pain due to #1 or ? vaginal cuff hematoma     S/P [facility] (09/06/11)  P: Urine for culture      Cipro 500 mg #14 1 po bid x 7 days      Percocet 5/325 # 30 1-2 po q 6 hours prn     Abdominal ultrasound next week, if pain does not improve    to evaluate vaginal cuff     RTO-post op exam   Aleyda Gindlesperger,  PA-C

## 2011-09-16 LAB — URINE CULTURE

## 2011-09-26 ENCOUNTER — Other Ambulatory Visit: Payer: BC Managed Care – PPO

## 2011-09-26 ENCOUNTER — Encounter: Payer: BC Managed Care – PPO | Admitting: Obstetrics and Gynecology

## 2011-10-01 ENCOUNTER — Encounter: Payer: Self-pay | Admitting: Obstetrics and Gynecology

## 2011-10-01 ENCOUNTER — Ambulatory Visit (INDEPENDENT_AMBULATORY_CARE_PROVIDER_SITE_OTHER): Payer: BC Managed Care – PPO | Admitting: Obstetrics and Gynecology

## 2011-10-01 VITALS — BP 116/78 | Temp 97.7°F | Wt 263.0 lb

## 2011-10-01 DIAGNOSIS — Z90710 Acquired absence of both cervix and uterus: Secondary | ICD-10-CM

## 2011-10-01 NOTE — Progress Notes (Signed)
DATE OF SURGERY: 09/06/11 TYPE OF SURGERY: Hysterectomy/Cystoscopy PAIN:No VAG BLEEDING: no VAG DISCHARGE: yes NORMAL GI FUNCTN: yes NORMAL GU FUNCTN: yes Pt desires to return to work early BP 116/78  Temp 97.7 F (36.5 C) (Oral)  Wt 263 lb (119.296 kg)  LMP 08/07/2011 Physical Examination: General appearance - alert, well appearing, and in no distress Mental status - alert, oriented to person, place, and time Chest - clear to auscultation, no wheezes, rales or rhonchi, symmetric air entry Heart - normal rate and regular rhythm Abdomen - soft, nontender, nondistended, no masses or organomegaly Pelvic -cuff well healed.no adnexal masses,  Extremities - peripheral pulses normal, no pedal edema, no clubbing or cyanosis S/p [facility] cystoscopy 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

## 2011-10-18 ENCOUNTER — Encounter: Payer: BC Managed Care – PPO | Admitting: Obstetrics and Gynecology

## 2014-01-25 ENCOUNTER — Encounter: Payer: Self-pay | Admitting: Obstetrics and Gynecology

## 2014-11-04 ENCOUNTER — Emergency Department (HOSPITAL_COMMUNITY)
Admission: EM | Admit: 2014-11-04 | Discharge: 2014-11-04 | Disposition: A | Discharge status: Home / Self Care | Payer: BLUE CROSS/BLUE SHIELD | Attending: Emergency Medicine | Admitting: Emergency Medicine

## 2014-11-04 ENCOUNTER — Emergency Department (HOSPITAL_COMMUNITY): Payer: BLUE CROSS/BLUE SHIELD

## 2014-11-04 ENCOUNTER — Encounter (HOSPITAL_COMMUNITY): Payer: Self-pay | Admitting: Emergency Medicine

## 2014-11-04 DIAGNOSIS — Z86018 Personal history of other benign neoplasm: Secondary | ICD-10-CM | POA: Diagnosis not present

## 2014-11-04 DIAGNOSIS — Z8541 Personal history of malignant neoplasm of cervix uteri: Secondary | ICD-10-CM | POA: Diagnosis not present

## 2014-11-04 DIAGNOSIS — R079 Chest pain, unspecified: Secondary | ICD-10-CM

## 2014-11-04 DIAGNOSIS — R0789 Other chest pain: Secondary | ICD-10-CM | POA: Diagnosis not present

## 2014-11-04 DIAGNOSIS — F419 Anxiety disorder, unspecified: Secondary | ICD-10-CM | POA: Diagnosis not present

## 2014-11-04 DIAGNOSIS — F1010 Alcohol abuse, uncomplicated: Secondary | ICD-10-CM | POA: Diagnosis not present

## 2014-11-04 DIAGNOSIS — F319 Bipolar disorder, unspecified: Secondary | ICD-10-CM | POA: Insufficient documentation

## 2014-11-04 DIAGNOSIS — Z79899 Other long term (current) drug therapy: Secondary | ICD-10-CM | POA: Insufficient documentation

## 2014-11-04 DIAGNOSIS — R0602 Shortness of breath: Secondary | ICD-10-CM | POA: Diagnosis not present

## 2014-11-04 DIAGNOSIS — E876 Hypokalemia: Secondary | ICD-10-CM | POA: Diagnosis not present

## 2014-11-04 DIAGNOSIS — I10 Essential (primary) hypertension: Secondary | ICD-10-CM | POA: Diagnosis not present

## 2014-11-04 DIAGNOSIS — Z862 Personal history of diseases of the blood and blood-forming organs and certain disorders involving the immune mechanism: Secondary | ICD-10-CM | POA: Insufficient documentation

## 2014-11-04 LAB — COMPREHENSIVE METABOLIC PANEL
ALT: 72 U/L — AB (ref 14–54)
AST: 102 U/L — AB (ref 15–41)
Albumin: 3.6 g/dL (ref 3.5–5.0)
Alkaline Phosphatase: 95 U/L (ref 38–126)
Anion gap: 12 (ref 5–15)
BUN: 7 mg/dL (ref 6–20)
CALCIUM: 8.4 mg/dL — AB (ref 8.9–10.3)
CHLORIDE: 101 mmol/L (ref 101–111)
CO2: 24 mmol/L (ref 22–32)
Creatinine, Ser: 0.54 mg/dL (ref 0.44–1.00)
GFR calc Af Amer: >60 mL/min (ref >60)
Glucose, Bld: 110 mg/dL — ABNORMAL HIGH (ref 65–99)
Potassium: 3.3 mmol/L — ABNORMAL LOW (ref 3.5–5.1)
SODIUM: 137 mmol/L (ref 135–145)
Total Bilirubin: 1.1 mg/dL (ref 0.3–1.2)
Total Protein: 6.7 g/dL (ref 6.5–8.1)

## 2014-11-04 LAB — BRAIN NATRIURETIC PEPTIDE: B Natriuretic Peptide: 10 pg/mL (ref 0.0–100.0)

## 2014-11-04 LAB — CBC
HCT: 35 % — ABNORMAL LOW (ref 36.0–46.0)
Hemoglobin: 12.3 g/dL (ref 12.0–15.0)
MCH: 31.5 pg (ref 26.0–34.0)
MCHC: 35.1 g/dL (ref 30.0–36.0)
MCV: 89.5 fL (ref 78.0–100.0)
PLATELETS: 261 10*3/uL (ref 150–400)
RBC: 3.91 MIL/uL (ref 3.87–5.11)
RDW: 15.8 % — AB (ref 11.5–15.5)
WBC: 5.8 10*3/uL (ref 4.0–10.5)

## 2014-11-04 LAB — I-STAT TROPONIN, ED: Troponin i, poc: 0 ng/mL (ref 0.00–0.08)

## 2014-11-04 MED ORDER — POTASSIUM CHLORIDE CRYS ER 20 MEQ PO TBCR
20.0000 meq | EXTENDED_RELEASE_TABLET | Freq: Once | ORAL | Status: AC
  Administered 2014-11-04: 20 meq via ORAL
  Filled 2014-11-04: qty 1

## 2014-11-04 NOTE — ED Provider Notes (Signed)
CSN: [id number]     Arrival date & time 11/04/14  5329 History   First MD Initiated Contact with Patient 11/04/14 1101     Chief Complaint  Patient presents with  . Shortness of Breath     (Consider location/radiation/quality/duration/timing/severity/associated sxs/prior Treatment) HPI Comments: Shelby Howard is a 44 y.o. female with a PMHx of HTN, bipolar, anxiety, depression, anemia, and precancerous lesion of cervix s/p lap hysterectomy, who presents to the ED with complaints of gradual onset shortness breath 3 months associated with intermittent chest pain at rest which is not currently ongoing. She states that she went to her PCP Dr. Luvenia Starch this morning, and was told that her blood pressure medication needed to be adjusted but was told also to come to the ER for evaluation "just in case". She denies any aggravating or alleviating factors for her shortness of breath, states that he has been constant for 3 months, and unchanged overall. Denies any fevers, chills, chest pain, cough, hemoptysis, wheezing, leg swelling, recent travel/surgery/immobilization, active cancer, estrogen use, smoking history, DVT/PE history in herself or in her family, abdominal pain, nausea vomiting diarrhea, constipation, melena, hematochezia, dysuria, hematuria, diaphoresis, lightheadedness, dizziness, claudication, orthopnea. Denies any family history of cardiac disease. She admits to drinking approximately 1 bottle of wine 4 times per week.  Patient is a 44 y.o. female presenting with shortness of breath. The history is provided by the patient. No language interpreter was used.  Shortness of Breath Severity:  Mild Onset quality:  Gradual Duration:  3 months Timing:  Constant Progression:  Unchanged Chronicity:  Chronic Relieved by:  None tried Worsened by:  Nothing tried Ineffective treatments:  None tried Associated symptoms: no abdominal pain, no chest pain, no claudication, no cough, no diaphoresis, no  fever, no hemoptysis, no sputum production, no vomiting and no wheezing   Risk factors: alcohol use and obesity   Risk factors: no family hx of DVT, no hx of cancer, no hx of PE/DVT, no oral contraceptive use, no prolonged immobilization, no recent surgery and no tobacco use     Past Medical History  Diagnosis Date  . Fibroids   . Bipolar disorder   . Anxiety   . Depression   . Insomnia   . Anemia     history  . Endocervical adenocarcinoma    Past Surgical History  Procedure Laterality Date  . Dilation and curettage of uterus  09/2007    resection of polyp  . Svd  1998    x 1  . Cervical conization w/bx  03/02/2011    Procedure: CONIZATION CERVIX WITH BIOPSY;  Surgeon: Betsy Coder, MD;  Location: [facility];  Service: Gynecology;  Laterality: N/A;  Cold Knife Conization, Endocervical Curretage  . Laparoscopic hysterectomy  09/06/2011    Procedure: HYSTERECTOMY TOTAL LAPAROSCOPIC;  Surgeon: Betsy Coder, MD;  Location: [facility];  Service: Gynecology;  Laterality: N/A;  . Cystoscopy  09/06/2011    Procedure: CYSTOSCOPY;  Surgeon: Betsy Coder, MD;  Location: [facility];  Service: Gynecology;  Laterality: N/A;   Family History  Problem Relation Age of Onset  . Hypertension Mother    Social History  Substance Use Topics  . Smoking status: Never Smoker   . Smokeless tobacco: Never Used  . Alcohol Use: 2.4 oz/week    4 Glasses of wine per week     Comment: weekly -wine   OB History    Gravida Para Term Preterm AB TAB SAB Ectopic Multiple Living   1  1        1     Review of Systems  Constitutional: Negative for fever, chills and diaphoresis.  Respiratory: Positive for shortness of breath. Negative for cough, hemoptysis, sputum production and wheezing.   Cardiovascular: Negative for chest pain, claudication and leg swelling.  Gastrointestinal: Negative for nausea, vomiting, abdominal pain, diarrhea, constipation and blood in stool.  Genitourinary: Negative for dysuria and  hematuria.  Musculoskeletal: Negative for myalgias and arthralgias.  Skin: Negative for color change.  Allergic/Immunologic: Negative for immunocompromised state.  Neurological: Negative for dizziness, weakness, light-headedness and numbness.  Psychiatric/Behavioral: Negative for confusion.   10 Systems reviewed and are negative for acute change except as noted in the HPI.    Allergies  Review of patient's allergies indicates no known allergies.  Home Medications   Prior to Admission medications   Medication Sig Start Date End Date Taking? Authorizing Provider  ALPRAZolam Duanne Moron) 1 MG tablet Take 1 mg by mouth at bedtime as needed for anxiety.   Yes Historical Provider, MD  citalopram (CELEXA) 20 MG tablet Take 30 mg by mouth daily. 10/07/14  Yes Historical Provider, MD  ibuprofen (ADVIL,MOTRIN) 600 MG tablet Take 600 mg by mouth every 6 (six) hours as needed. For headache/Cramps   Yes Historical Provider, MD  lisinopril (PRINIVIL,ZESTRIL) 5 MG tablet Take 5 mg by mouth daily. 10/28/14  Yes Historical Provider, MD  zaleplon (SONATA) 5 MG capsule Take 5 mg by mouth daily as needed for sleep.  10/07/14  Yes Historical Provider, MD  zolpidem (AMBIEN) 10 MG tablet Take 10 mg by mouth at bedtime as needed for sleep.    Yes Historical Provider, MD   BP 157/102 mmHg  Pulse 91  Temp(Src) 98 F (36.7 C) (Oral)  Resp 24  Ht 5\' 7"  (1.702 m)  Wt 257 lb (116.574 kg)  BMI 40.24 kg/m2  SpO2 97%  LMP 08/07/2011 Physical Exam  Constitutional: She is oriented to person, place, and time. Vital signs are normal. She appears well-developed and well-nourished.  Non-toxic appearance. No distress.  Afebrile, nontoxic, NAD. Obese. Mildly elevated BP at 150-160/100-102  HENT:  Head: Normocephalic and atraumatic.  Mouth/Throat: Oropharynx is clear and moist and mucous membranes are normal.  Eyes: Conjunctivae and EOM are normal. Right eye exhibits no discharge. Left eye exhibits no discharge.  Neck: Normal  range of motion. Neck supple. No JVD present.  Cardiovascular: Normal rate, regular rhythm, normal heart sounds and intact distal pulses.  Exam reveals no gallop and no friction rub.   No murmur heard. RRR, nl s1/s2, no m/r/g, distal pulses intact, no pedal edema   Pulmonary/Chest: Effort normal and breath sounds normal. No respiratory distress. She has no decreased breath sounds. She has no wheezes. She has no rhonchi. She has no rales.  CTAB in all lung fields, no w/r/r, no hypoxia or increased WOB, speaking in full sentences, SpO2 100% on RA   Abdominal: Soft. Normal appearance and bowel sounds are normal. She exhibits no distension. There is no tenderness. There is no rigidity, no rebound, no guarding, no CVA tenderness, no tenderness at McBurney's point and negative Murphy's sign.  Musculoskeletal: Normal range of motion.  MAE x4 Strength and sensation grossly intact Distal pulses intact No pedal edema, neg homan's bilaterally   Neurological: She is alert and oriented to person, place, and time. She has normal strength. No sensory deficit.  Skin: Skin is warm, dry and intact. No rash noted.  Psychiatric: She has a normal mood and  affect.  Nursing note and vitals reviewed.   ED Course  Procedures (including critical care time) Labs Review Labs Reviewed  COMPREHENSIVE METABOLIC PANEL - Abnormal; Notable for the following:    Potassium 3.3 (*)    Glucose, Bld 110 (*)    Calcium 8.4 (*)    AST 102 (*)    ALT 72 (*)    All other components within normal limits  CBC - Abnormal; Notable for the following:    HCT 35.0 (*)    RDW 15.8 (*)    All other components within normal limits  BRAIN NATRIURETIC PEPTIDE  I-STAT TROPOININ, ED    Imaging Review Dg Chest 2 View  11/04/2014   CLINICAL DATA:  Shortness of breath.  EXAM: CHEST  2 VIEW  COMPARISON:  03/17/2007  FINDINGS: The heart size and mediastinal contours are within normal limits. Both lungs are clear. The visualized skeletal  structures are unremarkable.  IMPRESSION: Normal chest.   Electronically Signed   By: Lorriane Shire M.D.   On: 11/04/2014 11:04     EKG Interpretation None      MDM   Final diagnoses:  Shortness of breath  Intermittent chest pain  Essential hypertension  Alcohol abuse  Hypokalemia    44 y.o. female here with SOB x3 months, intermittent CP, had some this morning which completely resolved. Went to her PCPs office and was told her BP meds needed to be adjusted but to also come here to be evaluated "just to be safe". Upon assessment, BP mildly elevated at 150-160s/100s, no wheezing, no pedal edema. Trop neg. EKG unremarkable. BNP pending. CMP showing mildly low K at 3.3, will replete. AST/ALT slightly elevated, likely due to alcohol consumption (pt drinks ~1 bottle wine 4x/wk). CBC unremarkable. CXR clear. Will await BNP then likely d/c home with close PCP f/up, doubt need for ongoing testing. Doubt PE. Will reassess shortly.   11:45 AM BNP unremarkable. Will have her f/up with PCP in 1wk for ongoing management of her chronic SOB, as well as recheck of BP after her increase in medications. I explained the diagnosis and have given explicit precautions to return to the ER including for any other new or worsening symptoms. The patient understands and accepts the medical plan as it's been dictated and I have answered their questions. Discharge instructions concerning home care and prescriptions have been given. The patient is STABLE and is discharged to home in good condition.  BP 162/101 mmHg  Pulse 86  Temp(Src) 98 F (36.7 C) (Oral)  Resp 25  Ht 5\' 7"  (1.702 m)  Wt 257 lb (116.574 kg)  BMI 40.24 kg/m2  SpO2 100%  LMP 08/07/2011  Meds ordered this encounter  Medications  . potassium chloride SA (K-DUR,KLOR-CON) CR tablet 20 mEq    SigZacarias Pontes, PA-C 11/04/14 1146  Davonna Belling, MD 11/08/14 [phone]

## 2014-11-04 NOTE — ED Notes (Signed)
Pt has been having SOB for the past couple of months. Pt has intermittent CP with exertion. Pt denies CP at this moment, but had some this morning. Pt went to MD office this morning and was sent here to evaluate her SOB. 97% on room air. BP 158/108, HR 84, SR on monitor.

## 2014-11-04 NOTE — Discharge Instructions (Signed)
Your shortness of breath seems to be a more chronic issue. Your labs here revealed a slightly elevated liver enzymes, which is likely related to your alcohol consumption. It also revealed mildly low potassium likely from your blood pressure medications, but this is nothing to be concerned about. You will need to follow up with your regular doctor for ongoing management of your chronic shortness of breath. Try to consume less alcohol and eat a low salt diet. Return to the ER for changes or worsening symptoms.    Shortness of Breath Shortness of breath means you have trouble breathing. Shortness of breath needs medical care right away. HOME CARE   Do not smoke.  Avoid being around chemicals or things (paint fumes, dust) that may bother your breathing.  Rest as needed. Slowly begin your normal activities.  Only take medicines as told by your doctor.  Keep all doctor visits as told. GET HELP RIGHT AWAY IF:   Your shortness of breath gets worse.  You feel lightheaded, pass out (faint), or have a cough that is not helped by medicine.  You cough up blood.  You have pain with breathing.  You have pain in your chest, arms, shoulders, or belly (abdomen).  You have a fever.  You cannot walk up stairs or exercise the way you normally do.  You do not get better in the time expected.  You have a hard time doing normal activities even with rest.  You have problems with your medicines.  You have any new symptoms. MAKE SURE YOU:  Understand these instructions.  Will watch your condition.  Will get help right away if you are not doing well or get worse. Document Released: 08/29/2007 Document Revised: 03/17/2013 Document Reviewed: 05/28/2011 [facility] Patient Information 2015 [place], [place]. This information is not intended to replace advice given to you by your health care provider. Make sure you discuss any questions you have with your health care provider.  Potassium Content of  Foods Potassium is a mineral found in many foods and drinks. It helps keep fluids and minerals balanced in your body and affects how steadily your heart beats. Potassium also helps control your blood pressure and keep your muscles and nervous system healthy. Certain health conditions and medicines may change the balance of potassium in your body. When this happens, you can help balance your level of potassium through the foods that you do or do not eat. Your health care provider or dietitian may recommend an amount of potassium that you should have each day. The following lists of foods provide the amount of potassium (in parentheses) per serving in each item. HIGH IN POTASSIUM  The following foods and beverages have 200 mg or more of potassium per serving:  Apricots, 2 raw or 5 dry (200 mg).  Artichoke, 1 medium (345 mg).  Avocado, raw,  each (245 mg).  Banana, 1 medium (425 mg).  Beans, lima, or baked beans, canned,  cup (280 mg).  Beans, white, canned,  cup (595 mg).  Beef roast, 3 oz (320 mg).  Beef, ground, 3 oz (270 mg).  Beets, raw or cooked,  cup (260 mg).  Bran muffin, 2 oz (300 mg).  Broccoli,  cup (230 mg).  Brussels sprouts,  cup (250 mg).  Cantaloupe,  cup (215 mg).  Cereal, 100% bran,  cup (200-400 mg).  Cheeseburger, single, fast food, 1 each (225-400 mg).  Chicken, 3 oz (220 mg).  Clams, canned, 3 oz (535 mg).  Crab, 3 oz (225  mg).  Dates, 5 each (270 mg).  Dried beans and peas,  cup (300-475 mg).  Figs, dried, 2 each (260 mg).  Fish: halibut, tuna, cod, snapper, 3 oz (480 mg).  Fish: salmon, haddock, swordfish, perch, 3 oz (300 mg).  Fish, tuna, canned 3 oz (200 mg).  [place] fries, fast food, 3 oz (470 mg).  Granola with fruit and nuts,  cup (200 mg).  Grapefruit juice,  cup (200 mg).  Greens, beet,  cup (655 mg).  Honeydew melon,  cup (200 mg).  Kale, raw, 1 cup (300 mg).  Kiwi, 1 medium (240 mg).  Kohlrabi, rutabaga,  parsnips,  cup (280 mg).  Lentils,  cup (365 mg).  Mango, 1 each (325 mg).  Milk, chocolate, 1 cup (420 mg).  Milk: nonfat, low-fat, whole, buttermilk, 1 cup (350-380 mg).  Molasses, 1 Tbsp (295 mg).  Mushrooms,  cup (280) mg.  Nectarine, 1 each (275 mg).  Nuts: almonds, peanuts, hazelnuts, [place], cashew, mixed, 1 oz (200 mg).  Nuts, pistachios, 1 oz (295 mg).  Orange, 1 each (240 mg).  Orange juice,  cup (235 mg).  Papaya, medium,  fruit (390 mg).  Peanut butter, chunky, 2 Tbsp (240 mg).  Peanut butter, smooth, 2 Tbsp (210 mg).  Pear, 1 medium (200 mg).  Pomegranate, 1 whole (400 mg).  Pomegranate juice,  cup (215 mg).  Pork, 3 oz (350 mg).  Potato chips, salted, 1 oz (465 mg).  Potato, baked with skin, 1 medium (925 mg).  Potatoes, boiled,  cup (255 mg).  Potatoes, mashed,  cup (330 mg).  Prune juice,  cup (370 mg).  Prunes, 5 each (305 mg).  Pudding, chocolate,  cup (230 mg).  Pumpkin, canned,  cup (250 mg).  Raisins, seedless,  cup (270 mg).  Seeds, sunflower or pumpkin, 1 oz (240 mg).  Soy milk, 1 cup (300 mg).  Spinach,  cup (420 mg).  Spinach, canned,  cup (370 mg).  Sweet potato, baked with skin, 1 medium (450 mg).  Swiss chard,  cup (480 mg).  Tomato or vegetable juice,  cup (275 mg).  Tomato sauce or puree,  cup (400-550 mg).  Tomato, raw, 1 medium (290 mg).  Tomatoes, canned,  cup (200-300 mg).  [place], 3 oz (250 mg).  Wheat germ, 1 oz (250 mg).  Winter squash,  cup (250 mg).  Yogurt, plain or fruited, 6 oz (260-435 mg).  Zucchini,  cup (220 mg). MODERATE IN POTASSIUM The following foods and beverages have 50-200 mg of potassium per serving:  Apple, 1 each (150 mg).  Apple juice,  cup (150 mg).  Applesauce,  cup (90 mg).  Apricot nectar,  cup (140 mg).  Asparagus, small spears,  cup or 6 spears (155 mg).  Bagel, cinnamon raisin, 1 each (130 mg).  Bagel, egg or plain, 4 in., 1 each  (70 mg).  Beans, green,  cup (90 mg).  Beans, yellow,  cup (190 mg).  Beer, regular, 12 oz (100 mg).  Beets, canned,  cup (125 mg).  Blackberries,  cup (115 mg).  Blueberries,  cup (60 mg).  Bread, whole wheat, 1 slice (70 mg).  Broccoli, raw,  cup (145 mg).  Cabbage,  cup (150 mg).  Carrots, cooked or raw,  cup (180 mg).  Cauliflower, raw,  cup (150 mg).  Celery, raw,  cup (155 mg).  Cereal, bran flakes, cup (120-150 mg).  Cheese, cottage,  cup (110 mg).  Cherries, 10 each (150 mg).  Chocolate, 1 oz bar (165  mg).  Coffee, brewed 6 oz (90 mg).  Corn,  cup or 1 ear (195 mg).  Cucumbers,  cup (80 mg).  Egg, large, 1 each (60 mg).  Eggplant,  cup (60 mg).  Endive, raw, cup (80 mg).  English muffin, 1 each (65 mg).  Fish, orange roughy, 3 oz (150 mg).  Frankfurter, beef or pork, 1 each (75 mg).  Fruit cocktail,  cup (115 mg).  Grape juice,  cup (170 mg).  Grapefruit,  fruit (175 mg).  Grapes,  cup (155 mg).  Greens: kale, turnip, collard,  cup (110-150 mg).  Ice cream or frozen yogurt, chocolate,  cup (175 mg).  Ice cream or frozen yogurt, vanilla,  cup (120-150 mg).  Lemons, limes, 1 each (80 mg).  Lettuce, all types, 1 cup (100 mg).  Mixed vegetables,  cup (150 mg).  Mushrooms, raw,  cup (110 mg).  Nuts: walnuts, pecans, or macadamia, 1 oz (125 mg).  Oatmeal,  cup (80 mg).  Okra,  cup (110 mg).  Onions, raw,  cup (120 mg).  Peach, 1 each (185 mg).  Peaches, canned,  cup (120 mg).  Pears, canned,  cup (120 mg).  Peas, green, frozen,  cup (90 mg).  Peppers, green,  cup (130 mg).  Peppers, red,  cup (160 mg).  Pineapple juice,  cup (165 mg).  Pineapple, fresh or canned,  cup (100 mg).  Plums, 1 each (105 mg).  Pudding, vanilla,  cup (150 mg).  Raspberries,  cup (90 mg).  Rhubarb,  cup (115 mg).  Rice, wild,  cup (80 mg).  Shrimp, 3 oz (155 mg).  Spinach, raw, 1 cup (170  mg).  Strawberries,  cup (125 mg).  Summer squash  cup (175-200 mg).  Swiss chard, raw, 1 cup (135 mg).  Tangerines, 1 each (140 mg).  Tea, brewed, 6 oz (65 mg).  Turnips,  cup (140 mg).  Watermelon,  cup (85 mg).  Wine, red, table, 5 oz (180 mg).  Wine, white, table, 5 oz (100 mg). LOW IN POTASSIUM The following foods and beverages have less than 50 mg of potassium per serving.  Bread, white, 1 slice (30 mg).  Carbonated beverages, 12 oz (less than 5 mg).  Cheese, 1 oz (20-30 mg).  Cranberries,  cup (45 mg).  Cranberry juice cocktail,  cup (20 mg).  Fats and oils, 1 Tbsp (less than 5 mg).  Hummus, 1 Tbsp (32 mg).  Nectar: papaya, mango, or pear,  cup (35 mg).  Rice, white or brown,  cup (50 mg).  Spaghetti or macaroni,  cup cooked (30 mg).  Tortilla, flour or corn, 1 each (50 mg).  Waffle, 4 in., 1 each (50 mg).  Water chestnuts,  cup (40 mg). Document Released: 10/24/2004 Document Revised: 03/17/2013 Document Reviewed: 02/06/2013 [facility] Patient Information 2015 [place], [place]. This information is not intended to replace advice given to you by your health care provider. Make sure you discuss any questions you have with your health care provider.  Hypertension Hypertension is another name for high blood pressure. High blood pressure forces your heart to work harder to pump blood. A blood pressure reading has two numbers, which includes a higher number over a lower number (example: 110/72). HOME CARE   Have your blood pressure rechecked by your doctor.  Only take medicine as told by your doctor. Follow the directions carefully. The medicine does not work as well if you skip doses. Skipping doses also puts you at risk for problems.  Do not  smoke.  Monitor your blood pressure at home as told by your doctor. GET HELP IF:  You think you are having a reaction to the medicine you are taking.  You have repeat headaches or feel dizzy.  You have  puffiness (swelling) in your ankles.  You have trouble with your vision. GET HELP RIGHT AWAY IF:   You get a very bad headache and are confused.  You feel weak, numb, or faint.  You get chest or belly (abdominal) pain.  You throw up (vomit).  You cannot breathe very well. MAKE SURE YOU:   Understand these instructions.  Will watch your condition.  Will get help right away if you are not doing well or get worse. Document Released: 08/29/2007 Document Revised: 03/17/2013 Document Reviewed: 01/02/2013 [facility] Patient Information 2015 [place], [place]. This information is not intended to replace advice given to you by your health care provider. Make sure you discuss any questions you have with your health care provider.  How to Take Your Blood Pressure HOW DO I GET A BLOOD PRESSURE MACHINE?  You can buy an electronic home blood pressure machine at your local pharmacy. Insurance will sometimes cover the cost if you have a prescription.  Ask your doctor what type of machine is best for you. There are different machines for your arm and your wrist.  If you decide to buy a machine to check your blood pressure on your arm, first check the size of your arm so you can buy the right size cuff. To check the size of your arm:   Use a measuring tape that shows both inches and centimeters.   Wrap the measuring tape around the upper-middle part of your arm. You may need someone to help you measure.   Write down your arm measurement in both inches and centimeters.   To measure your blood pressure correctly, it is important to have the right size cuff.   If your arm is up to 13 inches (up to 34 centimeters), get an adult cuff size.  If your arm is 13 to 17 inches (35 to 44 centimeters), get a large adult cuff size.    If your arm is 17 to 20 inches (45 to 52 centimeters), get an adult thigh cuff.  WHAT DO THE NUMBERS MEAN?   There are two numbers that make up your blood pressure.  For example: 120/80.  The first number (120 in our example) is called the "systolic pressure." It is a measure of the pressure in your blood vessels when your heart is pumping blood.  The second number (80 in our example) is called the "diastolic pressure." It is a measure of the pressure in your blood vessels when your heart is resting between beats.  Your doctor will tell you what your blood pressure should be. WHAT SHOULD I DO BEFORE I CHECK MY BLOOD PRESSURE?   Try to rest or relax for at least 30 minutes before you check your blood pressure.  Do not smoke.  Do not have any drinks with caffeine, such as:  Soda.  Coffee.  Tea.  Check your blood pressure in a quiet room.  Sit down and stretch out your arm on a table. Keep your arm at about the level of your heart. Let your arm relax.  Make sure that your legs are not crossed. HOW DO I CHECK MY BLOOD PRESSURE?  Follow the directions that came with your machine.  Make sure you remove any tight-fighting clothing from your  arm or wrist. Wrap the cuff around your upper arm or wrist. You should be able to fit a finger between the cuff and your arm. If you cannot fit a finger between the cuff and your arm, it is too tight and should be removed and rewrapped.  Some units require you to manually pump up the arm cuff.  Automatic units inflate the cuff when you press a button.  Cuff deflation is automatic in both models.  After the cuff is inflated, the unit measures your blood pressure and pulse. The readings are shown on a monitor. Hold still and breathe normally while the cuff is inflated.  Getting a reading takes less than a minute.  Some models store readings in a memory. Some provide a printout of readings. If your machine does not store your readings, keep a written record.  Take readings with you to your next visit with your doctor. Document Released: 02/23/2008 Document Revised: 07/27/2013 Document Reviewed:  05/07/2013 [facility] Patient Information 2015 [place], [place]. This information is not intended to replace advice given to you by your health care provider. Make sure you discuss any questions you have with your health care provider.  How Much is Too Much Alcohol? Drinking too much alcohol can cause injury, accidents, and health problems. These types of problems can include:   Car crashes.  Falls.  Family fighting (domestic violence).  Drowning.  Fights.  Injuries.  Burns.  Damage to certain organs.  Having a baby with birth defects. ONE DRINK CAN BE TOO MUCH WHEN YOU ARE:  Working.  Pregnant or breastfeeding.  Taking medicines. Ask your doctor.  Driving or planning to drive. WHAT IS A STANDARD DRINK?   1 regular beer (12 ounces or 360 milliliters).  1 glass of wine (5 ounces or 150 milliliters).  1 shot of liquor (1.5 ounces or 45 milliliters). BLOOD ALCOHOL LEVELS   .00 A person is sober.  Marland Kitchen03 A person has no trouble keeping balance, talking, or seeing right, but a "buzz" may be felt.  Marland Kitchen05 A person feels "buzzed" and relaxed.  Marland Kitchen08 or .10  A person is drunk. He or she has trouble talking, seeing right, and keeping his or her balance.  .15 A person loses body control and may pass out (blackout).  .20 A person has trouble walking (staggering) and throws up (vomits).  .30 A person will pass out (unconscious).  .40+ A person will be in a coma. Death is possible. If you or someone you know has a drinking problem, get help from a doctor.  Document Released: 01/06/2009 Document Revised: 06/04/2011 Document Reviewed: 01/06/2009 [facility] Patient Information 2015 [place], [place]. This information is not intended to replace advice given to you by your health care provider. Make sure you discuss any questions you have with your health care provider.  DASH Eating Plan DASH stands for "Dietary Approaches to Stop Hypertension." The DASH eating plan is a healthy eating  plan that has been shown to reduce high blood pressure (hypertension). Additional health benefits may include reducing the risk of type 2 diabetes mellitus, heart disease, and stroke. The DASH eating plan may also help with weight loss. WHAT DO I NEED TO KNOW ABOUT THE DASH EATING PLAN? For the DASH eating plan, you will follow these general guidelines:  Choose foods with a percent daily value for sodium of less than 5% (as listed on the food label).  Use salt-free seasonings or herbs instead of table salt or sea salt.  Check with  your health care provider or pharmacist before using salt substitutes.  Eat lower-sodium products, often labeled as "lower sodium" or "no salt added."  Eat fresh foods.  Eat more vegetables, fruits, and low-fat dairy products.  Choose whole grains. Look for the word "whole" as the first word in the ingredient list.  Choose fish and skinless chicken or [place] more often than red meat. Limit fish, poultry, and meat to 6 oz (170 g) each day.  Limit sweets, desserts, sugars, and sugary drinks.  Choose heart-healthy fats.  Limit cheese to 1 oz (28 g) per day.  Eat more home-cooked food and less restaurant, buffet, and fast food.  Limit fried foods.  Cook foods using methods other than frying.  Limit canned vegetables. If you do use them, rinse them well to decrease the sodium.  When eating at a restaurant, ask that your food be prepared with less salt, or no salt if possible. WHAT FOODS CAN I EAT? Seek help from a dietitian for individual calorie needs. Grains Whole grain or whole wheat bread. Brown rice. Whole grain or whole wheat pasta. Quinoa, bulgur, and whole grain cereals. Low-sodium cereals. Corn or whole wheat flour tortillas. Whole grain cornbread. Whole grain crackers. Low-sodium crackers. Vegetables Fresh or frozen vegetables (raw, steamed, roasted, or grilled). Low-sodium or reduced-sodium tomato and vegetable juices. Low-sodium or  reduced-sodium tomato sauce and paste. Low-sodium or reduced-sodium canned vegetables.  Fruits All fresh, canned (in natural juice), or frozen fruits. Meat and Other Protein Products Ground beef (85% or leaner), grass-fed beef, or beef trimmed of fat. Skinless chicken or [place]. Ground chicken or [place]. Pork trimmed of fat. All fish and seafood. Eggs. Dried beans, peas, or lentils. Unsalted nuts and seeds. Unsalted canned beans. Dairy Low-fat dairy products, such as skim or 1% milk, 2% or reduced-fat cheeses, low-fat ricotta or cottage cheese, or plain low-fat yogurt. Low-sodium or reduced-sodium cheeses. Fats and Oils Tub margarines without trans fats. Light or reduced-fat mayonnaise and salad dressings (reduced sodium). Avocado. Safflower, olive, or canola oils. Natural peanut or almond butter. Other Unsalted popcorn and pretzels. The items listed above may not be a complete list of recommended foods or beverages. Contact your dietitian for more options. WHAT FOODS ARE NOT RECOMMENDED? Grains White bread. White pasta. White rice. Refined cornbread. Bagels and croissants. Crackers that contain trans fat. Vegetables Creamed or fried vegetables. Vegetables in a cheese sauce. Regular canned vegetables. Regular canned tomato sauce and paste. Regular tomato and vegetable juices. Fruits Dried fruits. Canned fruit in light or heavy syrup. Fruit juice. Meat and Other Protein Products Fatty cuts of meat. Ribs, chicken wings, bacon, sausage, bologna, salami, chitterlings, fatback, hot dogs, bratwurst, and packaged luncheon meats. Salted nuts and seeds. Canned beans with salt. Dairy Whole or 2% milk, cream, half-and-half, and cream cheese. Whole-fat or sweetened yogurt. Full-fat cheeses or blue cheese. Nondairy creamers and whipped toppings. Processed cheese, cheese spreads, or cheese curds. Condiments Onion and garlic salt, seasoned salt, table salt, and sea salt. Canned and packaged gravies.  Worcestershire sauce. Tartar sauce. Barbecue sauce. Teriyaki sauce. Soy sauce, including reduced sodium. Steak sauce. Fish sauce. Oyster sauce. Cocktail sauce. Horseradish. Ketchup and mustard. Meat flavorings and tenderizers. Bouillon cubes. Hot sauce. Tabasco sauce. Marinades. Taco seasonings. Relishes. Fats and Oils Butter, stick margarine, lard, shortening, ghee, and bacon fat. Coconut, palm kernel, or palm oils. Regular salad dressings. Other Pickles and olives. Salted popcorn and pretzels. The items listed above may not be a complete list of foods and beverages  to avoid. Contact your dietitian for more information. WHERE CAN I FIND MORE INFORMATION? [employer], [employer], [employer]: travelstabloid.com Document Released: 03/01/2011 Document Revised: 07/27/2013 Document Reviewed: 01/14/2013 [facility] Patient Information 2015 [place], [place]. This information is not intended to replace advice given to you by your health care provider. Make sure you discuss any questions you have with your health care provider.

## 2015-11-11 ENCOUNTER — Encounter (HOSPITAL_COMMUNITY): Payer: Self-pay | Admitting: Emergency Medicine

## 2015-11-11 ENCOUNTER — Emergency Department (HOSPITAL_COMMUNITY)
Admission: EM | Admit: 2015-11-11 | Discharge: 2015-11-12 | Disposition: A | Discharge status: Other Acute Inpatient Hospital | Payer: BLUE CROSS/BLUE SHIELD | Attending: Emergency Medicine | Admitting: Emergency Medicine

## 2015-11-11 DIAGNOSIS — Y999 Unspecified external cause status: Secondary | ICD-10-CM | POA: Diagnosis not present

## 2015-11-11 DIAGNOSIS — S51812A Laceration without foreign body of left forearm, initial encounter: Secondary | ICD-10-CM | POA: Insufficient documentation

## 2015-11-11 DIAGNOSIS — Z5181 Encounter for therapeutic drug level monitoring: Secondary | ICD-10-CM | POA: Insufficient documentation

## 2015-11-11 DIAGNOSIS — F332 Major depressive disorder, recurrent severe without psychotic features: Secondary | ICD-10-CM | POA: Diagnosis not present

## 2015-11-11 DIAGNOSIS — Z23 Encounter for immunization: Secondary | ICD-10-CM | POA: Diagnosis not present

## 2015-11-11 DIAGNOSIS — X788XXA Intentional self-harm by other sharp object, initial encounter: Secondary | ICD-10-CM | POA: Diagnosis not present

## 2015-11-11 DIAGNOSIS — Y92002 Bathroom of unspecified non-institutional (private) residence single-family (private) house as the place of occurrence of the external cause: Secondary | ICD-10-CM | POA: Diagnosis not present

## 2015-11-11 DIAGNOSIS — S51811A Laceration without foreign body of right forearm, initial encounter: Secondary | ICD-10-CM | POA: Insufficient documentation

## 2015-11-11 DIAGNOSIS — S51819A Laceration without foreign body of unspecified forearm, initial encounter: Secondary | ICD-10-CM

## 2015-11-11 DIAGNOSIS — Y9389 Activity, other specified: Secondary | ICD-10-CM | POA: Insufficient documentation

## 2015-11-11 DIAGNOSIS — Z8542 Personal history of malignant neoplasm of other parts of uterus: Secondary | ICD-10-CM | POA: Insufficient documentation

## 2015-11-11 DIAGNOSIS — R45851 Suicidal ideations: Secondary | ICD-10-CM | POA: Diagnosis present

## 2015-11-11 LAB — CBC
HEMATOCRIT: 33.7 % — AB (ref 36.0–46.0)
HEMOGLOBIN: 11.8 g/dL — AB (ref 12.0–15.0)
MCH: 30.8 pg (ref 26.0–34.0)
MCHC: 35 g/dL (ref 30.0–36.0)
MCV: 88 fL (ref 78.0–100.0)
Platelets: 288 10*3/uL (ref 150–400)
RBC: 3.83 MIL/uL — AB (ref 3.87–5.11)
RDW: 14.8 % (ref 11.5–15.5)
WBC: 12.2 10*3/uL — AB (ref 4.0–10.5)

## 2015-11-11 LAB — COMPREHENSIVE METABOLIC PANEL
ALK PHOS: 76 U/L (ref 38–126)
ALT: 23 U/L (ref 14–54)
AST: 25 U/L (ref 15–41)
Albumin: 4 g/dL (ref 3.5–5.0)
Anion gap: 9 (ref 5–15)
BUN: 13 mg/dL (ref 6–20)
CALCIUM: 9.6 mg/dL (ref 8.9–10.3)
CO2: 23 mmol/L (ref 22–32)
CREATININE: 0.83 mg/dL (ref 0.44–1.00)
Chloride: 108 mmol/L (ref 101–111)
Glucose, Bld: 103 mg/dL — ABNORMAL HIGH (ref 65–99)
Potassium: 3.1 mmol/L — ABNORMAL LOW (ref 3.5–5.1)
SODIUM: 140 mmol/L (ref 135–145)
Total Bilirubin: 0.6 mg/dL (ref 0.3–1.2)
Total Protein: 7.7 g/dL (ref 6.5–8.1)

## 2015-11-11 MED ORDER — TETANUS-DIPHTH-ACELL PERTUSSIS 5-2.5-18.5 LF-MCG/0.5 IM SUSP
0.5000 mL | Freq: Once | INTRAMUSCULAR | Status: AC
  Administered 2015-11-12: 0.5 mL via INTRAMUSCULAR
  Filled 2015-11-11: qty 0.5

## 2015-11-11 MED ORDER — LIDOCAINE-EPINEPHRINE 2 %-1:100000 IJ SOLN
10.0000 mL | Freq: Once | INTRAMUSCULAR | Status: AC
  Administered 2015-11-12: 10 mL
  Filled 2015-11-11: qty 1

## 2015-11-11 NOTE — ED Notes (Signed)
Bed: [facility]PT2 Expected date:  Expected time:  Means of arrival:  Comments: 

## 2015-11-11 NOTE — ED Triage Notes (Signed)
Pt transported from home by EMS with, 911 called by 3rd party, pt found in bathtub, vertical lacs noted to bilat wrist using razor blades, bleeding controlled, bandaged by EMS. Pt admitted to EMS and [facility] this was a self inflicted suicide attempt. Per [employer] pt's family directed to magistrate for IVC.

## 2015-11-11 NOTE — ED Provider Notes (Signed)
[facility] DEPT Provider Note   CSN: [id number] Arrival date & time: 11/11/15  2207  By signing my name below, I, Shelby Howard, attest that this documentation has been prepared under the direction and in the presence of [employer], PA-C.  Electronically Signed: Reola Howard, ED Scribe. 11/11/15. 11:10 PM.  History   Chief Complaint Chief Complaint  Patient presents with  . Suicidal   The history is provided by the patient and the EMS personnel. No language interpreter was used.   HPI Comments: Shelby Howard is a 45 y.o. female BIB EMS, with a PMHx significant of anxiety, bipolar 1, and depression, who presents to the Emergency Department s/p attempted suicide via slitting both of her wrist with a razor blade that occurred PTA. Bleeding is controlled with pressure dressings applied by EMS. [employer] was called by a third party who found the patient. Pt reports that "I'm just depressed and I don't want to be here anymore". She notes that these thoughts have worsened over the past couple of weeks due to increasing pressures in her life, including financial issues with her mortgage and fighting more with her son. She additionally notes that she does not feel like she has a good support system in her home. Pt states that she only takes her daily medications for her history of psychiatric issues when she can remember to. Tetanus is not UTD.   Past Medical History:  Diagnosis Date  . Anemia    history  . Anxiety   . Bipolar disorder (Grandfather)   . Depression   . Endocervical adenocarcinoma (Shelby Howard)   . Fibroids   . Insomnia    Patient Active Problem List   Diagnosis Date Noted  . Fibroids   . Bipolar disorder ([name])   . Anxiety   . Depression   . Insomnia   . 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 CERVICAL CONIZATION W/BX  03/02/2011   Procedure: CONIZATION CERVIX WITH BIOPSY;   Surgeon: Betsy Coder, MD;  Location: [facility];  Service: Gynecology;  Laterality: N/A;  Cold Knife Conization, Endocervical Curretage  . CYSTOSCOPY  09/06/2011   Procedure: CYSTOSCOPY;  Surgeon: Betsy Coder, MD;  Location: [facility];  Service: Gynecology;  Laterality: N/A;  . DILATION AND CURETTAGE OF UTERUS  09/2007   resection of polyp  . LAPAROSCOPIC HYSTERECTOMY  09/06/2011   Procedure: HYSTERECTOMY TOTAL LAPAROSCOPIC;  Surgeon: Betsy Coder, MD;  Location: [facility];  Service: Gynecology;  Laterality: N/A;  . SVD  1998   x 1   OB History    Gravida Para Term Preterm AB Living   1 1       1    SAB TAB Ectopic Multiple Live Births                 Home Medications    Prior to Admission medications   Medication Sig Start Date End Date Taking? Authorizing Provider  ALPRAZolam Duanne Moron) 1 MG tablet Take 1 mg by mouth at bedtime as needed for anxiety.    Historical Provider, MD  citalopram (CELEXA) 20 MG tablet Take 30 mg by mouth daily. 10/07/14   Historical Provider, MD  ibuprofen (ADVIL,MOTRIN) 600 MG tablet Take 600 mg by mouth every 6 (six) hours as needed. For headache/Cramps    Historical Provider, MD  lisinopril (PRINIVIL,ZESTRIL) 5 MG tablet Take 5 mg by mouth daily. 10/28/14  Historical Provider, MD  zaleplon (SONATA) 5 MG capsule Take 5 mg by mouth daily as needed for sleep.  10/07/14   Historical Provider, MD  zolpidem (AMBIEN) 10 MG tablet Take 10 mg by mouth at bedtime as needed for sleep.     Historical Provider, MD   Family History Family History  Problem Relation Age of Onset  . Hypertension Mother    Social History Social History  Substance Use Topics  . Smoking status: Never Smoker  . Smokeless tobacco: Never Used  . Alcohol use 2.4 oz/week    4 Glasses of wine per week     Comment: weekly -wine   Allergies   Review of patient's allergies indicates no known allergies.  Review of Systems Review of Systems  Psychiatric/Behavioral: Positive for behavioral  problems and suicidal ideas.  A complete 10 system review of systems was obtained and all systems are negative except as noted in the HPI and PMH.    Physical Exam Updated Vital Signs BP 140/86 (BP Location: Right Arm)   Pulse 93   Temp 97.9 F (36.6 C) (Oral)   Resp 18   LMP 08/07/2011   SpO2 95%   Physical Exam  Constitutional: She is oriented to person, place, and time. She appears well-developed and well-nourished. No distress.  Nontoxic appearing  HENT:  Head: Normocephalic and atraumatic.  Eyes: Conjunctivae and EOM are normal. No scleral icterus.  Neck: Normal range of motion.  Cardiovascular: Normal rate, regular rhythm and intact distal pulses.   Distal radial pulse 2+ b/l  Pulmonary/Chest: Effort normal. No respiratory distress.  Respirations even and unlabored  Musculoskeletal: Normal range of motion.  Neurological: She is alert and oriented to person, place, and time.  Skin: Skin is warm and dry. No rash noted. She is not diaphoretic. No erythema. No pallor.  7cm laceration to the volar left forearm; vertical. 2 lacerations to the volar right forearm, each 2cm in length.  Psychiatric: Her speech is normal and behavior is normal. She exhibits a depressed mood. She expresses suicidal ideation. She expresses no suicidal plans.  Nursing note and vitals reviewed.   ED Treatments / Results  DIAGNOSTIC STUDIES: Oxygen Saturation is 95% on RA, adequate by my interpretation.   COORDINATION OF CARE: 11:08 PM-Discussed next steps with pt. Pt verbalized understanding and is agreeable with the plan.   Labs (all labs ordered are listed, but only abnormal results are displayed) Labs Reviewed  COMPREHENSIVE METABOLIC PANEL - Abnormal; Notable for the following:       Result Value   Potassium 3.1 (*)    Glucose, Bld 103 (*)    All other components within normal limits  ETHANOL - Abnormal; Notable for the following:    Alcohol, Ethyl (B) 226 (*)    All other components  within normal limits  ACETAMINOPHEN LEVEL - Abnormal; Notable for the following:    Acetaminophen (Tylenol), Serum <10 (*)    All other components within normal limits  CBC - Abnormal; Notable for the following:    WBC 12.2 (*)    RBC 3.83 (*)    Hemoglobin 11.8 (*)    HCT 33.7 (*)    All other components within normal limits  SALICYLATE LEVEL  URINE RAPID DRUG SCREEN, HOSP PERFORMED    EKG  EKG Interpretation None       Radiology No results found.  Procedures Procedures (including critical care time)  Medications Ordered in ED Medications  lidocaine-EPINEPHrine (XYLOCAINE W/EPI) 2 %-1:100000 (with pres)  injection 10 mL (not administered)  Tdap (BOOSTRIX) injection 0.5 mL (0.5 mLs Intramuscular Given 11/12/15 0116)     Initial Impression / Assessment and Plan / ED Course  I have reviewed the triage vital signs and the nursing notes.  Pertinent labs & imaging results that were available during my care of the patient were reviewed by me and considered in my medical decision making (see chart for details).  Clinical Course    LACERATION REPAIR Performed by: Antonietta Breach Authorized by: Antonietta Breach Consent: Verbal consent obtained. Risks and benefits: risks, benefits and alternatives were discussed Consent given by: patient Patient identity confirmed: provided demographic data Prepped and Draped in normal sterile fashion Wound explored  Laceration Location: volar left forearm  Laceration Length: 7cm  No Foreign Bodies seen or palpated  Anesthesia: local infiltration  Local anesthetic: lidocaine 2% with epinephrine  Anesthetic total: 4 ml  Irrigation method: syringe Amount of cleaning: standard  Skin closure: 4-0 ethilon  Number of sutures: 6  Technique: simple interrupted  Patient tolerance: Patient tolerated the procedure well with no immediate complications.  2:13 AM Patient medically cleared. She has been accepted for further psychiatric care  at [facility]. Accepting MD, Dr. Parke Poisson. EMTALA completed for transfer.  Final Clinical Impressions(s) / ED Diagnoses   Final diagnoses:  Suicidal ideation  Severe episode of recurrent major depressive disorder, without psychotic features ([place])  Laceration of forearm, unspecified laterality, initial encounter    New Prescriptions New Prescriptions   No medications on file    I personally performed the services described in this documentation, which was scribed in my presence. The recorded information has been reviewed and is accurate.       Antonietta Breach, PA-C 11/12/15 [id number]    Blanchie Dessert, MD 11/13/15 [phone]

## 2015-11-12 ENCOUNTER — Encounter (HOSPITAL_COMMUNITY): Payer: Self-pay | Admitting: Nurse Practitioner

## 2015-11-12 ENCOUNTER — Inpatient Hospital Stay (HOSPITAL_COMMUNITY)
Admission: AD | Admit: 2015-11-12 | Discharge: 2015-11-15 | DRG: 885 | Disposition: A | Discharge status: Home / Self Care | Payer: BLUE CROSS/BLUE SHIELD | Source: Intra-hospital | Attending: Psychiatry | Admitting: Psychiatry

## 2015-11-12 DIAGNOSIS — F329 Major depressive disorder, single episode, unspecified: Secondary | ICD-10-CM | POA: Diagnosis present

## 2015-11-12 DIAGNOSIS — F3130 Bipolar disorder, current episode depressed, mild or moderate severity, unspecified: Secondary | ICD-10-CM | POA: Diagnosis present

## 2015-11-12 DIAGNOSIS — F1010 Alcohol abuse, uncomplicated: Secondary | ICD-10-CM | POA: Diagnosis present

## 2015-11-12 DIAGNOSIS — Z8249 Family history of ischemic heart disease and other diseases of the circulatory system: Secondary | ICD-10-CM | POA: Diagnosis not present

## 2015-11-12 DIAGNOSIS — F32A Depression, unspecified: Secondary | ICD-10-CM | POA: Diagnosis present

## 2015-11-12 DIAGNOSIS — F332 Major depressive disorder, recurrent severe without psychotic features: Secondary | ICD-10-CM | POA: Diagnosis not present

## 2015-11-12 DIAGNOSIS — F319 Bipolar disorder, unspecified: Secondary | ICD-10-CM | POA: Diagnosis present

## 2015-11-12 DIAGNOSIS — F419 Anxiety disorder, unspecified: Secondary | ICD-10-CM | POA: Diagnosis present

## 2015-11-12 DIAGNOSIS — Z8541 Personal history of malignant neoplasm of cervix uteri: Secondary | ICD-10-CM

## 2015-11-12 DIAGNOSIS — S51812A Laceration without foreign body of left forearm, initial encounter: Secondary | ICD-10-CM | POA: Diagnosis not present

## 2015-11-12 DIAGNOSIS — G4700 Insomnia, unspecified: Secondary | ICD-10-CM | POA: Diagnosis present

## 2015-11-12 DIAGNOSIS — Z818 Family history of other mental and behavioral disorders: Secondary | ICD-10-CM

## 2015-11-12 DIAGNOSIS — I10 Essential (primary) hypertension: Secondary | ICD-10-CM | POA: Diagnosis present

## 2015-11-12 LAB — RAPID URINE DRUG SCREEN, HOSP PERFORMED
AMPHETAMINES: NOT DETECTED
BARBITURATES: NOT DETECTED
BENZODIAZEPINES: POSITIVE — AB
Cocaine: NOT DETECTED
Opiates: NOT DETECTED
TETRAHYDROCANNABINOL: NOT DETECTED

## 2015-11-12 LAB — SALICYLATE LEVEL: Salicylate Lvl: <4 mg/dL (ref 2.8–30.0)

## 2015-11-12 LAB — ACETAMINOPHEN LEVEL: Acetaminophen (Tylenol), Serum: <10 ug/mL — ABNORMAL LOW (ref 10–30)

## 2015-11-12 LAB — ETHANOL: Alcohol, Ethyl (B): 226 mg/dL — ABNORMAL HIGH (ref <5)

## 2015-11-12 MED ORDER — ZOLPIDEM TARTRATE 10 MG PO TABS
10.0000 mg | ORAL_TABLET | Freq: Every evening | ORAL | Status: DC | PRN
  Administered 2015-11-12 – 2015-11-15 (×3): 10 mg via ORAL
  Filled 2015-11-12 (×3): qty 1

## 2015-11-12 MED ORDER — IBUPROFEN 600 MG PO TABS: 600.0000 mg | ORAL_TABLET | Freq: Four times a day (QID) | ORAL | Status: DC | PRN

## 2015-11-12 MED ORDER — ALPRAZOLAM 0.5 MG PO TABS: 0.5000 mg | ORAL_TABLET | Freq: Every evening | ORAL | Status: DC | PRN

## 2015-11-12 MED ORDER — CHLORDIAZEPOXIDE HCL 25 MG PO CAPS
25.0000 mg | ORAL_CAPSULE | Freq: Four times a day (QID) | ORAL | Status: DC | PRN
  Filled 2015-11-12: qty 1

## 2015-11-12 MED ORDER — CITALOPRAM HYDROBROMIDE 20 MG PO TABS
20.0000 mg | ORAL_TABLET | Freq: Every day | ORAL | Status: DC
  Administered 2015-11-12 – 2015-11-15 (×4): 20 mg via ORAL
  Filled 2015-11-12 (×5): qty 1

## 2015-11-12 MED ORDER — ALPRAZOLAM 1 MG PO TABS: 1.0000 mg | ORAL_TABLET | Freq: Every evening | ORAL | Status: DC | PRN

## 2015-11-12 MED ORDER — GABAPENTIN 100 MG PO CAPS
100.0000 mg | ORAL_CAPSULE | Freq: Three times a day (TID) | ORAL | Status: DC
  Administered 2015-11-12 – 2015-11-15 (×9): 100 mg via ORAL
  Filled 2015-11-12 (×15): qty 1

## 2015-11-12 MED ORDER — LURASIDONE HCL 40 MG PO TABS
40.0000 mg | ORAL_TABLET | Freq: Every day | ORAL | Status: DC
  Administered 2015-11-13 – 2015-11-15 (×3): 40 mg via ORAL
  Filled 2015-11-12 (×5): qty 1

## 2015-11-12 MED ORDER — CITALOPRAM HYDROBROMIDE 20 MG PO TABS
ORAL_TABLET | ORAL | Status: AC
  Filled 2015-11-12: qty 1

## 2015-11-12 MED ORDER — ACETAMINOPHEN 325 MG PO TABS: 650.0000 mg | ORAL_TABLET | Freq: Four times a day (QID) | ORAL | Status: DC | PRN

## 2015-11-12 MED ORDER — CITALOPRAM HYDROBROMIDE 20 MG PO TABS: 30.0000 mg | ORAL_TABLET | Freq: Every day | ORAL | Status: DC

## 2015-11-12 MED ORDER — HYDROXYZINE HCL 25 MG PO TABS
25.0000 mg | ORAL_TABLET | ORAL | Status: DC | PRN
  Administered 2015-11-12: 25 mg via ORAL
  Filled 2015-11-12: qty 1

## 2015-11-12 MED ORDER — ALUM & MAG HYDROXIDE-SIMETH 200-200-20 MG/5ML PO SUSP: 30.0000 mL | ORAL | Status: DC | PRN

## 2015-11-12 MED ORDER — LISINOPRIL 5 MG PO TABS
5.0000 mg | ORAL_TABLET | Freq: Every day | ORAL | Status: DC
  Administered 2015-11-12 – 2015-11-15 (×4): 5 mg via ORAL
  Filled 2015-11-12 (×5): qty 1

## 2015-11-12 MED ORDER — LISINOPRIL 5 MG PO TABS
ORAL_TABLET | ORAL | Status: AC
  Filled 2015-11-12: qty 1

## 2015-11-12 MED ORDER — MAGNESIUM HYDROXIDE 400 MG/5ML PO SUSP: 30.0000 mL | Freq: Every day | ORAL | Status: DC | PRN

## 2015-11-12 MED ORDER — POTASSIUM CHLORIDE CRYS ER 20 MEQ PO TBCR
40.0000 meq | EXTENDED_RELEASE_TABLET | Freq: Once | ORAL | Status: AC
  Administered 2015-11-12: 40 meq via ORAL
  Filled 2015-11-12: qty 2

## 2015-11-12 NOTE — BHH Group Notes (Signed)
[facility] Group Notes:  (Clinical Social Work)   01/22/2015       Summary of Progress/Problems:   In today's process group a decisional balance exercise was used to explore in depth the perceived benefits and costs of alcohol and drugs, as well as the  benefits and costs of replacing these with healthy coping skills.  Patients listed healthy and unhealthy coping techniques, determining with CSW guidance that unhealthy coping techniques work initially, but eventually become harmful.  Motivational Interviewing and the whiteboard were utilized for the exercises.  The patient expressed that the unhealthy coping she often uses is Netflix watching all the time, binge eating, and drinking while her healthy coping involves writing fiction.  Type of Therapy:  Group Therapy - Process   Participation Level:  Active  Participation Quality:  Attentive  Affect:  Blunted  Cognitive:  Appropriate  Insight:  Improving  Engagement in Therapy:  Engaged  Modes of Intervention:  Education, Motivational Interviewing  Selmer Dominion, LCSW 11/12/2015, 1:42 PM

## 2015-11-12 NOTE — BHH Suicide Risk Assessment (Signed)
[facility] Admission Suicide Risk Assessment   Nursing information obtained from:  Patient Demographic factors:  Divorced or widowed, Low socioeconomic status Current Mental Status:  Suicidal ideation indicated by patient Loss Factors:  Financial problems / change in socioeconomic status Historical Factors:  Prior suicide attempts, Family history of mental illness or substance abuse Risk Reduction Factors:  Employed, Living with another person, especially a relative  Total Time spent with patient: 30 minutes Principal Problem: Major depressive disorder, recurrent episode, severe ([place]) Diagnosis:   Patient Active Problem List   Diagnosis Date Noted  . Major depressive disorder, recurrent episode, severe ([place]) [F33.2] 11/12/2015  . Alcohol use disorder, mild, abuse [F10.10] 11/12/2015  . Fibroids [D25.9]   . Bipolar disorder ([place]) [F31.9]   . Anxiety [F41.9]   . Depression [F32.9]   . Insomnia [G47.00]   . 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 CIS (carcinoma in situ of cervix) [D06.9] 04/20/2011   Subjective Data: patient states " I wanted to die - I slit my self . I feel ok now. When can I leave.'   Continued Clinical Symptoms:  Alcohol Use Disorder Identification Test Final Score (AUDIT): 7 The "Alcohol Use Disorders Identification Test", Guidelines for Use in Primary Care, Second Edition.  World Pharmacologist [facility]). Score between 0-7:  no or low risk or alcohol related problems. Score between 8-15:  moderate risk of alcohol related problems. Score between 16-19:  high risk of alcohol related problems. Score 20 or above:  warrants further diagnostic evaluation for alcohol dependence and treatment.   CLINICAL FACTORS:   Depression:   Comorbid alcohol abuse/dependence Alcohol/Substance Abuse/Dependencies   Musculoskeletal: Strength & Muscle Tone: within normal limits Gait & Station: normal Patient leans: N/A  Psychiatric Specialty Exam: Physical Exam  Nursing  note and vitals reviewed.   Review of Systems  Psychiatric/Behavioral: Positive for depression, substance abuse and suicidal ideas (S/P suicide attempt- cut bl wrist ). The patient is nervous/anxious.   All other systems reviewed and are negative.   Blood pressure (!) 136/92, pulse (!) 115, temperature 98.7 F (37.1 C), resp. rate 18, height 5\' 7"  (1.702 m), weight 116.6 kg (257 lb), last menstrual period 08/07/2011.Body mass index is 40.25 kg/m.  General Appearance: Fairly Groomed  Eye Contact:  Fair  Speech:  Clear and Coherent  Volume:  Normal  Mood:  Depressed  Affect:  Appropriate  Thought Process:  Goal Directed and Descriptions of Associations: Circumstantial  Orientation:  Full (Time, Place, and Person)  Thought Content:  Rumination  Suicidal Thoughts:  S/P slit BL wrist in a suicide attemptcontracts for safety on the unit  Homicidal Thoughts:  No  Memory:  Immediate;   Fair Recent;   Fair Remote;   Fair  Judgement:  Impaired  Insight:  Shallow  Psychomotor Activity:  Decreased  Concentration:  Concentration: Fair and Attention Span: Fair  Recall:  [employer] of Knowledge:  Fair  Language:  Fair  Akathisia:  No  Handed:  Right  AIMS (if indicated):     Assets:  Desire for Improvement  ADL's:  Intact  Cognition:  WNL  Sleep:  Number of Hours: 1.5      COGNITIVE FEATURES THAT CONTRIBUTE TO RISK:  Closed-mindedness, Polarized thinking and Thought constriction (tunnel vision)    SUICIDE RISK:   Severe:  Frequent, intense, and enduring suicidal ideation, specific plan, no subjective intent, but some objective markers of intent (i.e., choice of lethal method), the method is accessible, some limited preparatory behavior,  evidence of impaired self-control, severe dysphoria/symptomatology, multiple risk factors present, and few if any protective factors, particularly a lack of social support.   PLAN OF CARE: Patient is S/P suicide attempt. Will need IP stay. Case  discussed with Aggie NP - please see H&P.   I certify that inpatient services furnished can reasonably be expected to improve the patient's condition.  Kaden Daughdrill, MD 11/12/2015, 1:56 PM

## 2015-11-12 NOTE — Discharge Instructions (Signed)
Apply bacitracin or neosporin to your laceration sites. Keep the area dry and clean. Do not soak in water until healed. Have sutures removed in 7-10 days. Return for any new or concerning symptoms or signs of infection.

## 2015-11-12 NOTE — Progress Notes (Addendum)
ADMISSION NOTE:   Shelby Howard rates Anxiety and Depression 10/10. She states she is very depressed about her weight, her employment (she has her bachelor's degree in Social Work and has been having a difficult time finding a job as she does not have her Master's in [place] and many employers require a master's degree. She has been divorced for 18 years, she is not in a relationship and this has been very depressing for her. Her son who is 61 years old lives with her and is very supportive. She denies SI/HI/AVH at this time. Contracts for safety. Encouragement and support given. Q15 minute room checks for patient safety. Medications administered as prescribed.  Continue to monitor for patient safety and medication effectiveness

## 2015-11-12 NOTE — ED Notes (Signed)
Shelby Howard with patient

## 2015-11-12 NOTE — BH Assessment (Signed)
Assessment completed. Consulted Darlyne Russian, PA-C who recommended inpatient treatment. Informed Dr. Laneta Simmers of the recommendation.

## 2015-11-12 NOTE — Progress Notes (Signed)
Patient ID: Shelby Howard, female   DOB: 1970/08/19, 45 y.o.   MRN: [id number]   D: Pt has been very flat and depressed and very isolative on the unit today. Pt reported that she was just having a rough time and that she was not where she wanted to be in life. Pt had dressings to bilateral arms, both dressings were changed. Pt reported no other issues or concerns. Pt took all medications without any problems. Pt reported being negative SI/HI, no AH/VH noted. A: 15 min checks continued for patient safety. R: Pt safety maintained.

## 2015-11-12 NOTE — BH Assessment (Signed)
Pt has been accepted to [facility] [address] Bed 1 to attending Dr. Parke Poisson. Informed Antonietta Breach, PA-C of the disposition.

## 2015-11-12 NOTE — H&P (Signed)
Psychiatric Admission Assessment Adult  Patient Identification: Marda Breidenbach  MRN:  [id number]  Date of Evaluation:  11/12/2015  Chief Complaint: Suicide attempt by wrists cutting.  Principal Diagnosis: Bipolar affective disorder, depressed.  Diagnosis:   Patient Active Problem List   Diagnosis Date Noted  . Major depressive disorder, recurrent episode, severe ([place]) [F33.2] 11/12/2015  . Fibroids [D25.9]   . Bipolar disorder ([place]) [F31.9]   . Anxiety [F41.9]   . Depression [F32.9]   . Insomnia [G47.00]   . 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  Admitted to the [facility] adult unit from the [facility] ED with complaints of suicide attempt by cutting her wrists. During this admission assessments, she reports, "The paramedics brought me to the ED. My son called them. I had slit both my wrists last night. Everything just hit me at once. I have had bad financial situation. I have not paid my mortgage since May of this year. I don't make enough money at my job. The civil court keep leaving notes on my door & at my job. I'm frustrated. I can't find a job in my field of study. I studied social work. I was never having suicidal thoughts prior to last night. I have Bipolar disorder, diagnosed in 2013. Currently under the care of Dr. Darleene Cleaver.. I'm on Latuda & Celexa, Ambien prn, started 2 weeks ago. I was cleaning the house last night, when I keep seeing & finding these unpaid bills. That was when I snapped. I don't use drugs, except the one prescribed by my doctor. But, I do drink alcohol at night after to work, just to unwind".  Associated Signs/Symptoms:  Depression Symptoms:  depressed mood, insomnia, feelings of worthlessness/guilt, hopelessness, anxiety,  (Hypo) Manic Symptoms:   Impulsivity, Irritable Mood,  Anxiety Symptoms:  Excessive Worry,  Psychotic Symptoms:  Denies any hallucinations, delusional thoughts or paranoia.  PTSD Symptoms: Denies any Hx of PTSD or symptoms.  Total Time spent with patient: 1 hour  Past Psychiatric History: Bipolar disorder, Alcohol use disorder.  Is the patient at risk to self? Yes.    Has the patient been a risk to self in the past 6 months? Yes.    Has the patient been a risk to self within the distant past? No.  Is the patient a risk to others? No.  Has the patient been a risk to others in the past 6 months? No.  Has the patient been a risk to others within the distant past? No.   Prior Inpatient Therapy:   Prior Outpatient Therapy:    Alcohol Screening: Patient refused Alcohol Screening Tool: Yes 1. How often do you have a drink containing alcohol?: 2 to 4 times a month 2. How many drinks containing alcohol do you have on a typical day when you are drinking?: 3 or 4 3. How often do you have six or more drinks on one occasion?: Never Preliminary Score: 1 4. How often during the last year have you found that you were not able to stop drinking once you had started?: Never 5. How often during the last year have you failed to do what was normally expected from you becasue of drinking?: Never 6. How often during the last year have you needed a first drink in the morning to get yourself going after  a heavy drinking session?: Never 7. How often during the last year have you had a feeling of guilt of remorse after drinking?: Never 8. How often during the last year have you been unable to remember what happened the night before because you had been drinking?: Never 9. Have you or someone else been injured as a result of your drinking?: No 10. Has a relative or friend or a doctor or another health worker been concerned about your drinking or suggested you cut down?: Yes, during the last year Alcohol Use Disorder Identification  Test Final Score (AUDIT): 7 Brief Intervention: AUDIT score less than 7 or less-screening does not suggest unhealthy drinking-brief intervention not indicated  Substance Abuse History in the last 12 months:  Yes.    Consequences of Substance Abuse: Medical Consequences:  Liver damage, Possible death by overdose Legal Consequences:  Arrests, jail time, Loss of driving privilege. Family Consequences:  Family discord, divorce and or separation.  Previous Psychotropic Medications: Yes, (Latuda, Citalopram)  Psychological Evaluations: Yes   Past Medical History:  Past Medical History:  Diagnosis Date  . Anemia    history  . Anxiety   . Bipolar disorder ([place])   . Depression   . 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 Fibroids   . Insomnia     Past Surgical History:  Procedure Laterality Date  . CERVICAL CONIZATION W/BX  03/02/2011   Procedure: CONIZATION CERVIX WITH BIOPSY;  Surgeon: Betsy Coder, MD;  Location: [facility];  Service: Gynecology;  Laterality: N/A;  Cold Knife Conization, Endocervical Curretage  . CYSTOSCOPY  09/06/2011   Procedure: CYSTOSCOPY;  Surgeon: Betsy Coder, MD;  Location: [facility];  Service: Gynecology;  Laterality: N/A;  . DILATION AND CURETTAGE OF UTERUS  09/2007   resection of polyp  . LAPAROSCOPIC HYSTERECTOMY  09/06/2011   Procedure: HYSTERECTOMY TOTAL LAPAROSCOPIC;  Surgeon: Betsy Coder, MD;  Location: [facility];  Service: Gynecology;  Laterality: N/A;  . SVD  1998   x 1   Family History:  Family History  Problem Relation Age of Onset  . Hypertension Mother    Family Psychiatric  History: Bipolar disorder: Mother, sisters.  Tobacco Screening: Have you used any form of tobacco in the last 30 days? (Cigarettes, Smokeless Tobacco, Cigars, and/or Pipes): No  Social History:  History  Alcohol Use  . 2.4 oz/week  . 4 Glasses of wine per week    Comment: weekly -wine     History  Drug Use No    Additional Social History: Pain Medications:  n/a Prescriptions: n/a Over the Counter: n/a History of alcohol / drug use?: Yes Name of Substance 1: Alcohol  1 - Age of First Use: 17 1 - Amount (size/oz): 1 glass of wine  1 - Frequency: 3-4x weekly  1 - Duration: ongoing 1 - Last Use / Amount: 11-11-15  Allergies:  No Known Allergies  Lab Results:  Results for orders placed or performed during the hospital encounter of 11/11/15 (from the past 48 hour(s))  Comprehensive metabolic panel     Status: Abnormal   Collection Time: 11/11/15 11:19 PM  Result Value Ref Range   Sodium 140 135 - 145 mmol/L   Potassium 3.1 (L) 3.5 - 5.1 mmol/L   Chloride 108 101 - 111 mmol/L   CO2 23 22 - 32 mmol/L   Glucose, Bld 103 (H) 65 - 99 mg/dL   BUN 13 6 - 20 mg/dL   Creatinine, Ser 0.83 0.44 - 1.00 mg/dL  Calcium 9.6 8.9 - 10.3 mg/dL   Total Protein 7.7 6.5 - 8.1 g/dL   Albumin 4.0 3.5 - 5.0 g/dL   AST 25 15 - 41 U/L   ALT 23 14 - 54 U/L   Alkaline Phosphatase 76 38 - 126 U/L   Total Bilirubin 0.6 0.3 - 1.2 mg/dL   GFR calc non Af Amer >60 >60 mL/min   GFR calc Af Amer >60 >60 mL/min    Comment: (NOTE) The eGFR has been calculated using the CKD EPI equation. This calculation has not been validated in all clinical situations. eGFR's persistently <60 mL/min signify possible Chronic Kidney Disease.    Anion gap 9 5 - 15  cbc     Status: Abnormal   Collection Time: 11/11/15 11:19 PM  Result Value Ref Range   WBC 12.2 (H) 4.0 - 10.5 K/uL   RBC 3.83 (L) 3.87 - 5.11 MIL/uL   Hemoglobin 11.8 (L) 12.0 - 15.0 g/dL   HCT 33.7 (L) 36.0 - 46.0 %   MCV 88.0 78.0 - 100.0 fL   MCH 30.8 26.0 - 34.0 pg   MCHC 35.0 30.0 - 36.0 g/dL   RDW 14.8 11.5 - 15.5 %   Platelets 288 150 - 400 K/uL  Ethanol     Status: Abnormal   Collection Time: 11/11/15 11:20 PM  Result Value Ref Range   Alcohol, Ethyl (B) 226 (H) <5 mg/dL    Comment:        LOWEST DETECTABLE LIMIT FOR SERUM ALCOHOL IS 5 mg/dL FOR MEDICAL PURPOSES ONLY   Salicylate level     Status:  None   Collection Time: 11/11/15 11:20 PM  Result Value Ref Range   Salicylate Lvl <3.7 2.8 - 30.0 mg/dL  Acetaminophen level     Status: Abnormal   Collection Time: 11/11/15 11:20 PM  Result Value Ref Range   Acetaminophen (Tylenol), Serum <10 (L) 10 - 30 ug/mL    Comment:        THERAPEUTIC CONCENTRATIONS VARY SIGNIFICANTLY. A RANGE OF 10-30 ug/mL MAY BE AN EFFECTIVE CONCENTRATION FOR MANY PATIENTS. HOWEVER, SOME ARE BEST TREATED AT CONCENTRATIONS OUTSIDE THIS RANGE. ACETAMINOPHEN CONCENTRATIONS >150 ug/mL AT 4 HOURS AFTER INGESTION AND >50 ug/mL AT 12 HOURS AFTER INGESTION ARE OFTEN ASSOCIATED WITH TOXIC REACTIONS.   Rapid urine drug screen (hospital performed)     Status: Abnormal   Collection Time: 11/12/15  1:45 AM  Result Value Ref Range   Opiates NONE DETECTED NONE DETECTED   Cocaine NONE DETECTED NONE DETECTED   Benzodiazepines POSITIVE (A) NONE DETECTED   Amphetamines NONE DETECTED NONE DETECTED   Tetrahydrocannabinol NONE DETECTED NONE DETECTED   Barbiturates NONE DETECTED NONE DETECTED    Comment:        DRUG SCREEN FOR MEDICAL PURPOSES ONLY.  IF CONFIRMATION IS NEEDED FOR ANY PURPOSE, NOTIFY LAB WITHIN 5 DAYS.        LOWEST DETECTABLE LIMITS FOR URINE DRUG SCREEN Drug Class       Cutoff (ng/mL) Amphetamine      1000 Barbiturate      200 Benzodiazepine   902 Tricyclics       409 Opiates          300 Cocaine          300 THC              50    Blood Alcohol level:  Lab Results  Component Value Date   ETH 226 (H) 11/11/2015  Metabolic Disorder Labs:  No results found for: HGBA1C, MPG No results found for: PROLACTIN No results found for: CHOL, TRIG, HDL, CHOLHDL, VLDL, LDLCALC  Current Medications: Current Facility-Administered Medications  Medication Dose Route Frequency Provider Last Rate Last Dose  . acetaminophen (TYLENOL) tablet 650 mg  650 mg Oral Q6H PRN Jenne Campus, MD      . alum & mag hydroxide-simeth (MAALOX/MYLANTA) 200-200-20  MG/5ML suspension 30 mL  30 mL Oral Q4H PRN Jenne Campus, MD      . hydrOXYzine (ATARAX/VISTARIL) tablet 25 mg  25 mg Oral Q4H PRN Jenne Campus, MD   25 mg at 11/12/15 0403  . magnesium hydroxide (MILK OF MAGNESIA) suspension 30 mL  30 mL Oral Daily PRN Jenne Campus, MD       PTA Medications: Prescriptions Prior to Admission  Medication Sig Dispense Refill Last Dose  . ALPRAZolam (XANAX) 1 MG tablet Take 1 mg by mouth at bedtime as needed for anxiety.   11/03/2014 at Unknown time  . citalopram (CELEXA) 20 MG tablet Take 30 mg by mouth daily.  1 11/03/2014 at Unknown time  . ibuprofen (ADVIL,MOTRIN) 600 MG tablet Take 600 mg by mouth every 6 (six) hours as needed. For headache/Cramps   Past Month at Unknown time  . lisinopril (PRINIVIL,ZESTRIL) 5 MG tablet Take 5 mg by mouth daily.  0 11/03/2014 at Unknown time  . zaleplon (SONATA) 5 MG capsule Take 5 mg by mouth daily as needed for sleep.   1 Past Week at Unknown time  . zolpidem (AMBIEN) 10 MG tablet Take 10 mg by mouth at bedtime as needed for sleep.    11/03/2014 at Unknown time   Musculoskeletal: Strength & Muscle Tone: within normal limits Gait & Station: normal Patient leans: N/A  Psychiatric Specialty Exam: Physical Exam  Constitutional: She appears well-developed.  HENT:  Head: Normocephalic.  Eyes: Pupils are equal, round, and reactive to light.  Neck: Normal range of motion.  Cardiovascular: Normal rate.   Respiratory: Effort normal.  Genitourinary:  Genitourinary Comments: Denies any issues in this area  Musculoskeletal: Normal range of motion.  Neurological: She is alert.  Skin: Skin is warm.  Psychiatric: Her speech is normal and behavior is normal. Thought content normal. Her mood appears anxious. Her affect is not angry, not blunt, not labile and not inappropriate. Cognition and memory are normal. She expresses impulsivity. She exhibits a depressed mood.    Review of Systems  Constitutional: Positive for  malaise/fatigue.  HENT: Negative.   Eyes: Negative.   Respiratory: Negative.   Cardiovascular: Negative.   Gastrointestinal: Negative.   Genitourinary: Negative.   Musculoskeletal: Positive for myalgias.  Skin: Negative.  Negative for itching and rash.       Self inflicated  Neurological: Negative.   Endo/Heme/Allergies: Negative.   Psychiatric/Behavioral: Positive for depression and substance abuse. Negative for hallucinations and memory loss. The patient is nervous/anxious and has insomnia.     Blood pressure (!) 136/92, pulse (!) 115, temperature 98.7 F (37.1 C), resp. rate 18, height _0  (1.702 m), weight 116.6 kg (257 lb), last menstrual period 08/07/2011.Body mass index is 40.25 kg/m.  General Appearance: Disheveled  Eye Contact:  Good  Speech:  Clear and Coherent  Volume:  Normal  Mood:  Anxious, Depressed and tearful  Affect:  Flat and Tearful  Thought Process:  Coherent  Orientation:  Full (Time, Place, and Person)  Thought Content:  Denies any hallucinations, delusional thoughts or paranoia  Suicidal Thoughts:  Currently denies any thoughts, plans or intent. Able to contract for safety on the unit.  Homicidal Thoughts:  Denies any thoughts, plans or intent.  Memory:  Immediate;   Good Recent;   Good Remote;   Good  Judgement:  Fair  Insight:  Fair  Psychomotor Activity:  Decreased  Concentration:  Concentration: Fair and Attention Span: Fair  Recall:  Good  Fund of Knowledge:  Fair  Language:  Good  Akathisia:  Negative  Handed:  Right  AIMS (if indicated):     Assets:  Communication Skills Desire for Improvement Physical Health  ADL's:  Intact  Cognition:  WNL  Sleep:  Number of Hours: 1.5   Treatment Plan Summary: Daily contact with patient to assess and evaluate symptoms and progress in treatment and Medication management: 1. Admit for crisis management and stabilization, estimated length of stay 3-5 days.  2. Medication management to reduce current  symptoms to base line and improve the patient's overall level of functioning  3. Treat health problems as indicated.  4. Develop treatment plan to decrease risk of relapse upon discharge and the need for readmission.  5. Psycho-social education regarding relapse prevention and self care.  6. Health care follow up as needed for medical problems.  7. Review, reconcile, and reinstate any pertinent home medications for other health issues where appropriate. 8. Call for consults with hospitalist for any additional specialty patient care services as needed.  Observation Level/Precautions:  15 minute checks  Laboratory:  Per ED, UDS + for Benzodiazepine  Psychotherapy: Group sessions   Medications: See Mar for medication lists  Consultations: As needed   Discharge Concerns: Safety, mood stability   Estimated LOS: 2-4 days  Other: Admit to [id number]    I certify that inpatient services furnished can reasonably be expected to improve the patient's condition.    Encarnacion Slates, NP, PMPNP, FNP-BC 8/19/20179:45 AM

## 2015-11-12 NOTE — ED Notes (Signed)
Report called to Zelda, RN at [facility], [employer] called for transport to [facility].

## 2015-11-12 NOTE — Progress Notes (Signed)
Patient ID: Shelby Howard, female   DOB: 08/01/1970, 45 y.o.   MRN: [id number]   Pt currently presents with a flat affect and depressed, isolative behavior. Pt reports to writer that their goal is to "I'm not sure." Pt states "I am going to have to figure out how to cover my wrists when I go back to work." Pt concerned with wounds on wrists and dressings. Pt reports good sleep with current medication regimen.   Pt provided with medications per providers orders. Pt's labs and vitals were monitored throughout the night. Pt supported emotionally and encouraged to express concerns and questions. Pt educated on medications. L/R wounds redressed, pt tolerated well. Seri-sanguinous drainage noted from R wrist. Edges approximated, no erythema noted bilaterally.   Pt's safety ensured with 15 minute and environmental checks. Pt currently denies SI/HI and A/V hallucinations. Pt verbally agrees to seek staff if SI/HI or A/VH occurs and to consult with staff before acting on any harmful thoughts. Will continue POC.

## 2015-11-12 NOTE — BH Assessment (Signed)
Tele Assessment Note   Shelby Howard is an 45 y.o. female presenting to [facility] after a suicide attempt. PT reported that she is diagnosed with depression and bipolar. Pt shared that her birthday is tomorrow ([birth year]) and she is not where she want to be in life. Pt reported that she completed her degree in social work two years ago and is still looking for a job. Pt reported that tonight she left a note for her son on the mirror and cut her wrist. She shared that she called a family member and they called EMS so that she could get help. Pt did not report any previous suicide attempts. Pt shared that she is dealing with multiple stressors such as financial, not being able to find a job in her field and her weight. Pt reported that she is currently receiving medication management and shared that she is compliant with her medication most of the time. Pt reported that she has been having trouble having her Latuda filled due to issues with the insurance. Pt denied any illicit substance abuse and shared that she has "wet and dry spells" when referring to her alcohol intake. Pt's BAL is currently 226. Pt is endorsing multiple depressive symptoms but did not report any issues with her sleep. Pt did not report any physical, sexual or emotional abuse at this time.  Inpatient treatment is recommended for safety and stabilization.   Diagnosis: Major Depressive Disorder, Recurrent episode, Severe, Bipolar, Alcohol Use Disorder, Moderate  Past Medical History:  Past Medical History:  Diagnosis Date  . Anemia    history  . Anxiety   . Bipolar disorder ([place])   . Depression   . 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 Fibroids   . Insomnia     Past Surgical History:  Procedure Laterality Date  . CERVICAL CONIZATION W/BX  03/02/2011   Procedure: CONIZATION CERVIX WITH BIOPSY;  Surgeon: Betsy Coder, MD;  Location: [facility];  Service: Gynecology;  Laterality: N/A;  Cold Knife Conization, Endocervical Curretage  .  CYSTOSCOPY  09/06/2011   Procedure: CYSTOSCOPY;  Surgeon: Betsy Coder, MD;  Location: [facility];  Service: Gynecology;  Laterality: N/A;  . DILATION AND CURETTAGE OF UTERUS  09/2007   resection of polyp  . LAPAROSCOPIC HYSTERECTOMY  09/06/2011   Procedure: HYSTERECTOMY TOTAL LAPAROSCOPIC;  Surgeon: Betsy Coder, MD;  Location: [facility];  Service: Gynecology;  Laterality: N/A;  . SVD  1998   x 1    Family History:  Family History  Problem Relation Age of Onset  . Hypertension Mother     Social History:  reports that she has never smoked. She has never used smokeless tobacco. She reports that she drinks about 2.4 oz of alcohol per week . She reports that she does not use drugs.  Additional Social History:  Substance #1 Name of Substance 1: Alcohol  1 - Age of First Use: 17 1 - Amount (size/oz): 1 glass of wine  1 - Frequency: 3-4x weekly  1 - Duration: ongoing 1 - Last Use / Amount: 11-11-15  CIWA: CIWA-Ar BP: 140/86 Pulse Rate: 93 COWS:    PATIENT STRENGTHS: (choose at least two) Average or above average intelligence Communication skills  Allergies: No Known Allergies  Home Medications:  (Not in a hospital admission)  OB/GYN Status:  Patient's last menstrual period was 08/07/2011.  General Assessment Data Location of Assessment: [facility] TTS Assessment: In system Is this a Tele or Face-to-Face Assessment?: Face-to-Face Is this an  Initial Assessment or a Re-assessment for this encounter?: Initial Assessment Marital status: Single Living Arrangements: Children Can pt return to current living arrangement?: Yes Admission Status: Voluntary Is patient capable of signing voluntary admission?: Yes Referral Source: Self/Family/Friend Insurance type: [place] Living Arrangements: Children Name of Psychiatrist: Dr. Darleene Cleaver  Name of Therapist: No provider reported.  Education Status Is patient currently in school?: No Highest grade of school patient has  completed: College-Social Work   Risk to self with the past 6 months Suicidal Ideation: Yes-Currently Present Has patient been a risk to self within the past 6 months prior to admission? : Yes Suicidal Intent: Yes-Currently Present Has patient had any suicidal intent within the past 6 months prior to admission? : Yes Is patient at risk for suicide?: Yes Suicidal Plan?: Yes-Currently Present Has patient had any suicidal plan within the past 6 months prior to admission? : Yes Specify Current Suicidal Plan: pt cut wrist Access to Means: Yes Specify Access to Suicidal Means: access to sharps What has been your use of drugs/alcohol within the last 12 months?: Alcohol use reported.  Previous Attempts/Gestures: No How many times?: 0 Other Self Harm Risks: none reported Triggers for Past Attempts: Other (Comment) ("I am not where I want to be". ) Intentional Self Injurious Behavior: Cutting Comment - Self Injurious Behavior: cutting as a suicide attempt tonight Family Suicide History: No Recent stressful life event(s): Job Loss, Other (Comment) (weight ) Persecutory voices/beliefs?: No Depression: Yes Depression Symptoms: Despondent, Tearfulness, Isolating, Guilt, Fatigue, Loss of interest in usual pleasures, Feeling worthless/self pity, Feeling angry/irritable Substance abuse history and/or treatment for substance abuse?: Yes Suicide prevention information given to non-admitted patients: Not applicable  Risk to Others within the past 6 months Homicidal Ideation: No Does patient have any lifetime risk of violence toward others beyond the six months prior to admission? : No Thoughts of Harm to Others: No Current Homicidal Intent: No Current Homicidal Plan: No Access to Homicidal Means: No Identified Victim: N/A History of harm to others?: No Assessment of Violence: None Noted Violent Behavior Description: No violent behaviors observed.  Does patient have access to weapons?: No Criminal  Charges Pending?: No Does patient have a court date: No Is patient on probation?: No  Psychosis Hallucinations: None noted Delusions: None noted  Mental Status Report Appearance/Hygiene: In scrubs Eye Contact: Good Motor Activity: Freedom of movement Speech: Logical/coherent Level of Consciousness: Quiet/awake Mood: Depressed Affect: Appropriate to circumstance Anxiety Level: Minimal Thought Processes: Relevant, Coherent Judgement: Impaired Orientation: Person, Place, Time, Situation Obsessive Compulsive Thoughts/Behaviors: None  Cognitive Functioning Concentration: Decreased Memory: Recent Intact, Remote Intact IQ: Average Insight: Poor Impulse Control: Poor Appetite: Fair Sleep: No Change Total Hours of Sleep: 8 (with medication. ) Vegetative Symptoms: Staying in bed, Decreased grooming, Not bathing (weekends)  ADLScreening [facility] Assessment Services) Patient's cognitive ability adequate to safely complete daily activities?: Yes Patient able to express need for assistance with ADLs?: Yes Independently performs ADLs?: Yes (appropriate for developmental age)  Prior Inpatient Therapy Prior Inpatient Therapy: No  Prior Outpatient Therapy Prior Outpatient Therapy: Yes Prior Therapy Dates: 2015-current Prior Therapy Facilty/Provider(s): Dr. Darleene Cleaver  Reason for Treatment: Medication management-bipolar Does patient have an ACCT team?: No Does patient have Intensive In-House Services?  : No Does patient have [facility] services? : No Does patient have P4CC services?: No  ADL Screening (condition at time of admission) Patient's cognitive ability adequate to safely complete daily activities?: Yes Is the patient deaf or have  difficulty hearing?: No Does the patient have difficulty seeing, even when wearing glasses/contacts?: No Does the patient have difficulty concentrating, remembering, or making decisions?: No Patient able to express need for assistance with ADLs?:  Yes Does the patient have difficulty dressing or bathing?: No Independently performs ADLs?: Yes (appropriate for developmental age)       Abuse/Neglect Assessment (Assessment to be complete while patient is alone) Physical Abuse: Denies Verbal Abuse: Denies Sexual Abuse: Denies Exploitation of patient/patient's resources: Denies Self-Neglect: Denies     Regulatory affairs officer (For Healthcare) Does patient have an advance directive?: No Would patient like information on creating an advanced directive?: No - patient declined information    Additional Information 1:1 In Past 12 Months?: No CIRT Risk: No Elopement Risk: No Does patient have medical clearance?: Yes     Disposition:  Disposition Initial Assessment Completed for this Encounter: Yes  Shelby Howard S 11/12/2015 12:27 AM

## 2015-11-12 NOTE — ED Provider Notes (Signed)
LACERATION REPAIR Performed by: Elmer Ramp Authorized by: Elmer Ramp Consent: Verbal consent obtained. Risks and benefits: risks, benefits and alternatives were discussed Consent given by: patient Patient identity confirmed: provided demographic data Prepped and Draped in normal sterile fashion Wound explored  Laceration Location: right wrist  Laceration Length: 4 cm  No Foreign Bodies seen or palpated  Anesthesia: local infiltration  Local anesthetic: lidocaine 2% with epinephrine  Anesthetic total: 2 ml  Irrigation method: syringe Amount of cleaning: standard  Skin closure: simple  Number of sutures: 6  Technique: Simple interrupted   Patient tolerance: Patient tolerated the procedure well with no immediate complications.   Okey Regal, PA-C 11/12/15 [id number]    Blanchie Dessert, MD 11/12/15 0120

## 2015-11-12 NOTE — [facility] Group Notes (Signed)
Patient attended group but did not participate. 

## 2015-11-12 NOTE — Tx Team (Signed)
Initial Interdisciplinary Treatment Plan   PATIENT STRESSORS: Financial difficulties Occupational concerns   PATIENT STRENGTHS: Ability for insight Average or above average intelligence Capable of independent living Communication skills General fund of knowledge Motivation for treatment/growth Supportive family/friends   PROBLEM LIST: Problem List/Patient Goals Date to be addressed Date deferred Reason deferred Estimated date of resolution  "I don't know what can be the magic pill for now". 11-12-2015     Depression  11-12-2015     Suicide Attempt 11-12-2015                                          DISCHARGE CRITERIA:  Ability to meet basic life and health needs Improved stabilization in mood, thinking, and/or behavior Medical problems require only outpatient monitoring Need for constant or close observation no longer present Reduction of life-threatening or endangering symptoms to within safe limits Safe-care adequate arrangements made  PRELIMINARY DISCHARGE PLAN: Outpatient therapy Return to previous living arrangement Return to previous work or school arrangements  PATIENT/FAMIILY INVOLVEMENT: This treatment plan has been presented to and reviewed with the patient, Emaan Cheong.  The patient and family have been given the opportunity to ask questions and make suggestions.  Gildardo Pounds 11/12/2015, 5:42 AM

## 2015-11-13 DIAGNOSIS — F3130 Bipolar disorder, current episode depressed, mild or moderate severity, unspecified: Secondary | ICD-10-CM

## 2015-11-13 DIAGNOSIS — Z8249 Family history of ischemic heart disease and other diseases of the circulatory system: Secondary | ICD-10-CM

## 2015-11-13 DIAGNOSIS — Z818 Family history of other mental and behavioral disorders: Secondary | ICD-10-CM

## 2015-11-13 NOTE — Progress Notes (Addendum)
Shelby Howard is woken up this morning , as she sleeps ( at [phone]). She gets up as soon as this Probation officer calls her by name. She says " what do you want.". She is eager to answer this writer's questions, she denies SI today and she rates her depression, hopelessness and anxiety " 0/0/0/", respectively. AShe  Says she " slept ok", last night. She says " I don't need anything right now and then immediately returns to her room, gets in the bed and returns to sleep when this writer fiishes speaking with her.R ASafety is in place.

## 2015-11-13 NOTE — [facility] Group Notes (Signed)
[facility] Group Notes:  (Nursing/MHT/Case Management/Adjunct)  Date:  11/13/2015  Time:  1:01 PM  Type of Therapy:  Psychoeducational Skills  Participation Level:  Active  Participation Quality:  Appropriate and Attentive  Affect:  Depressed  Cognitive:  Appropriate  Insight:  Good  Engagement in Group:  Engaged  Modes of Intervention:  Discussion and Education  Summary of Progress/Problems: Patient attended group and was attentive. She was actively listening but reported she would rather speak to her RN after group 1:1. RN Chong Sicilian was made aware of this request.   Gaylan Gerold E 11/13/2015, 1:01 PM

## 2015-11-13 NOTE — [facility] Group Notes (Signed)
[facility] Group Notes:  (Clinical Social Work)  11/13/2015  10:00-11:00AM  Summary of Progress/Problems:   The main focus of today's process group was to process the disruptive behaviors that had been occurring on the 30 hall and creating problems with patients receiving the therapeutic care needed.  Per nursing staff, a helpful topic for group was identified as:  1)  Each patient focusing on their own recovery  2)  Treatment Agreement that each patient signed upon admission  And CSW further added the following:  1)  The importance of adding supports   2)  Possible means of dealing with the stigma about mental health issues.  At the beginning of group several patients were verbally aggressive with CSW.  These patients left the room in anger, as did several others who stated they could not handle the aggression and were going to their rooms.   The patient expressed that she did not agree with the anger that was expressed early in the group.  She was calm throughout even the loud talking, stayed focus on her own treatment.  She was able to identify a good support system that she did not use prior to the events that led to her hospitalization, not wanting to be a burden to people.  She identified advocacy for mental health issues as being very important to her, and liked the idea of using different verbiage.  Type of Therapy:  Process Group with Motivational Interviewing  Participation Level:  Active  Participation Quality:  Attentive, Sharing and Supportive  Affect:  Blunted  Cognitive:  Appropriate and Oriented  Insight:  Engaged  Engagement in Therapy:  Engaged  Modes of Intervention:   Education, Support and Processing, Activity  Selmer Dominion, LCSW 11/13/2015

## 2015-11-13 NOTE — Progress Notes (Signed)
Pt did not attend evening AA group. Pt denied invitation.

## 2015-11-13 NOTE — Progress Notes (Signed)
[facility] MD Progress Note  11/13/2015 3:02 PM Shelby Howard  MRN:  [id number]  Subjective: Shelby Howard reports, "I'm feeling a lot better today. My mood is improving. I'm not having any substance withdrawal symptoms. I'm attending group sessions, that helped some. I had a visit from my mother, sister & my son last evening, I feel good about the visit. I need to start talking about getting discharged from here". Shelby Howard is participating in group sessions. He denies any new issues. She is tolerating her treatment regimen.  Principal Problem: Bipolar I disorder, most recent episode depressed ([place])  Diagnosis:   Patient Active Problem List   Diagnosis Date Noted  . Alcohol use disorder, mild, abuse [F10.10] 11/12/2015  . Bipolar I disorder, most recent episode depressed ([place]) [F31.30] 11/12/2015  . Fibroids [D25.9]   . Bipolar disorder ([place]) [F31.9]   . Anxiety [F41.9]   . Depression [F32.9]   . Insomnia [G47.00]   . 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 Anemia    history  . Anxiety   . Bipolar disorder ([place])   . Depression   . 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 Fibroids   . Insomnia     Past Surgical History:  Procedure Laterality Date  . CERVICAL CONIZATION W/BX  03/02/2011   Procedure: CONIZATION CERVIX WITH BIOPSY;  Surgeon: Betsy Coder, MD;  Location: [facility];  Service: Gynecology;  Laterality: N/A;  Cold Knife Conization, Endocervical Curretage  . CYSTOSCOPY  09/06/2011   Procedure: CYSTOSCOPY;  Surgeon: Betsy Coder, MD;  Location: [facility];  Service: Gynecology;  Laterality: N/A;  . DILATION AND CURETTAGE OF UTERUS  09/2007   resection of polyp  . LAPAROSCOPIC HYSTERECTOMY  09/06/2011   Procedure: HYSTERECTOMY TOTAL LAPAROSCOPIC;  Surgeon: Betsy Coder, MD;   Location: [facility];  Service: Gynecology;  Laterality: N/A;  . SVD  1998   x 1   Family History:  Family History  Problem Relation Age of Onset  . Hypertension Mother   . Bipolar disorder Mother   . Bipolar disorder Sister    Family Psychiatric  History: See Md's SRA  Social History:  History  Alcohol Use  . 2.4 oz/week  . 4 Glasses of wine per week    Comment: weekly -wine     History  Drug Use No    Social History   Social History  . Marital status: Divorced    Spouse name: N/A  . Number of children: N/A  . Years of education: N/A   Social History Main Topics  . Smoking status: Never Smoker  . Smokeless tobacco: Never Used  . Alcohol use 2.4 oz/week    4 Glasses of wine per week     Comment: weekly -wine  . Drug use: No  . Sexual activity: No   Other Topics Concern  . None   Social History Narrative  . None   Additional Social History:    Pain Medications: n/a Prescriptions: n/a Over the Counter: n/a History of alcohol / drug use?: Yes Name of Substance 1: Alcohol  1 - Age of First Use: 17 1 - Amount (size/oz): 1 glass of wine  1 - Frequency: 3-4x weekly  1 - Duration: ongoing 1 - Last Use / Amount: 11-11-15  Sleep: Fair  Appetite:  Fair  Current Medications: Current Facility-Administered Medications  Medication Dose Route Frequency Provider Last Rate Last Dose  . acetaminophen (TYLENOL) tablet 650 mg  650 mg Oral Q6H PRN Jenne Campus, MD      . ALPRAZolam Duanne Moron) tablet 0.5 mg  0.5 mg Oral QHS PRN Encarnacion Slates, NP      . alum & mag hydroxide-simeth (MAALOX/MYLANTA) 200-200-20 MG/5ML suspension 30 mL  30 mL Oral Q4H PRN Jenne Campus, MD      . chlordiazePOXIDE (LIBRIUM) capsule 25 mg  25 mg Oral QID PRN Encarnacion Slates, NP      . citalopram (CELEXA) tablet 20 mg  20 mg Oral Daily Encarnacion Slates, NP   20 mg at 11/13/15 0834  . gabapentin (NEURONTIN) capsule 100 mg  100 mg Oral TID Encarnacion Slates, NP   100 mg at 11/13/15 0834  . hydrOXYzine  (ATARAX/VISTARIL) tablet 25 mg  25 mg Oral Q4H PRN Jenne Campus, MD   25 mg at 11/12/15 0403  . ibuprofen (ADVIL,MOTRIN) tablet 600 mg  600 mg Oral Q6H PRN Encarnacion Slates, NP      . lisinopril (PRINIVIL,ZESTRIL) tablet 5 mg  5 mg Oral Daily Encarnacion Slates, NP   5 mg at 11/13/15 0834  . lurasidone (LATUDA) tablet 40 mg  40 mg Oral Q breakfast Encarnacion Slates, NP   40 mg at 11/13/15 0834  . magnesium hydroxide (MILK OF MAGNESIA) suspension 30 mL  30 mL Oral Daily PRN Jenne Campus, MD      . zolpidem (AMBIEN) tablet 10 mg  10 mg Oral QHS PRN Encarnacion Slates, NP   10 mg at 11/12/15 2120   Lab Results:  Results for orders placed or performed during the hospital encounter of 11/11/15 (from the past 48 hour(s))  Comprehensive metabolic panel     Status: Abnormal   Collection Time: 11/11/15 11:19 PM  Result Value Ref Range   Sodium 140 135 - 145 mmol/L   Potassium 3.1 (L) 3.5 - 5.1 mmol/L   Chloride 108 101 - 111 mmol/L   CO2 23 22 - 32 mmol/L   Glucose, Bld 103 (H) 65 - 99 mg/dL   BUN 13 6 - 20 mg/dL   Creatinine, Ser 0.83 0.44 - 1.00 mg/dL   Calcium 9.6 8.9 - 10.3 mg/dL   Total Protein 7.7 6.5 - 8.1 g/dL   Albumin 4.0 3.5 - 5.0 g/dL   AST 25 15 - 41 U/L   ALT 23 14 - 54 U/L   Alkaline Phosphatase 76 38 - 126 U/L   Total Bilirubin 0.6 0.3 - 1.2 mg/dL   GFR calc non Af Amer >60 >60 mL/min   GFR calc Af Amer >60 >60 mL/min    Comment: (NOTE) The eGFR has been calculated using the CKD EPI equation. This calculation has not been validated in all clinical situations. eGFR's persistently <60 mL/min signify possible Chronic Kidney Disease.    Anion gap 9 5 - 15  cbc     Status: Abnormal   Collection Time: 11/11/15 11:19 PM  Result Value Ref Range   WBC 12.2 (H) 4.0 - 10.5 K/uL   RBC 3.83 (L) 3.87 - 5.11 MIL/uL   Hemoglobin 11.8 (L) 12.0 - 15.0 g/dL   HCT 33.7 (L) 36.0 - 46.0 %   MCV 88.0 78.0 - 100.0 fL   MCH 30.8 26.0 - 34.0 pg   MCHC 35.0 30.0 - 36.0 g/dL  RDW 14.8 11.5 - 15.5 %    Platelets 288 150 - 400 K/uL  Ethanol     Status: Abnormal   Collection Time: 11/11/15 11:20 PM  Result Value Ref Range   Alcohol, Ethyl (B) 226 (H) <5 mg/dL    Comment:        LOWEST DETECTABLE LIMIT FOR SERUM ALCOHOL IS 5 mg/dL FOR MEDICAL PURPOSES ONLY   Salicylate level     Status: None   Collection Time: 11/11/15 11:20 PM  Result Value Ref Range   Salicylate Lvl <4.0 2.8 - 30.0 mg/dL  Acetaminophen level     Status: Abnormal   Collection Time: 11/11/15 11:20 PM  Result Value Ref Range   Acetaminophen (Tylenol), Serum <10 (L) 10 - 30 ug/mL    Comment:        THERAPEUTIC CONCENTRATIONS VARY SIGNIFICANTLY. A RANGE OF 10-30 ug/mL MAY BE AN EFFECTIVE CONCENTRATION FOR MANY PATIENTS. HOWEVER, SOME ARE BEST TREATED AT CONCENTRATIONS OUTSIDE THIS RANGE. ACETAMINOPHEN CONCENTRATIONS >150 ug/mL AT 4 HOURS AFTER INGESTION AND >50 ug/mL AT 12 HOURS AFTER INGESTION ARE OFTEN ASSOCIATED WITH TOXIC REACTIONS.   Rapid urine drug screen (hospital performed)     Status: Abnormal   Collection Time: 11/12/15  1:45 AM  Result Value Ref Range   Opiates NONE DETECTED NONE DETECTED   Cocaine NONE DETECTED NONE DETECTED   Benzodiazepines POSITIVE (A) NONE DETECTED   Amphetamines NONE DETECTED NONE DETECTED   Tetrahydrocannabinol NONE DETECTED NONE DETECTED   Barbiturates NONE DETECTED NONE DETECTED    Comment:        DRUG SCREEN FOR MEDICAL PURPOSES ONLY.  IF CONFIRMATION IS NEEDED FOR ANY PURPOSE, NOTIFY LAB WITHIN 5 DAYS.        LOWEST DETECTABLE LIMITS FOR URINE DRUG SCREEN Drug Class       Cutoff (ng/mL) Amphetamine      1000 Barbiturate      200 Benzodiazepine   200 Tricyclics       300 Opiates          300 Cocaine          300 THC              50    Blood Alcohol level:  Lab Results  Component Value Date   ETH 226 (H) 11/11/2015   Metabolic Disorder Labs: No results found for: HGBA1C, MPG No results found for: PROLACTIN No results found for: CHOL, TRIG, HDL,  CHOLHDL, VLDL, LDLCALC  Physical Findings: AIMS: Facial and Oral Movements Muscles of Facial Expression: None, normal Lips and Perioral Area: None, normal Jaw: None, normal Tongue: None, normal,Extremity Movements Upper (arms, wrists, hands, fingers): None, normal Lower (legs, knees, ankles, toes): None, normal, Trunk Movements Neck, shoulders, hips: None, normal, Overall Severity Severity of abnormal movements (highest score from questions above): None, normal Incapacitation due to abnormal movements: None, normal Patient's awareness of abnormal movements (rate only patient's report): No Awareness, Dental Status Current problems with teeth and/or dentures?: No Does patient usually wear dentures?: No  CIWA:  CIWA-Ar Total: 0 COWS:  COWS Total Score: 1  Musculoskeletal: Strength & Muscle Tone: within normal limits Gait & Station: normal Patient leans: N/A  Psychiatric Specialty Exam: Physical Exam  Constitutional: She appears well-developed and well-nourished.  HENT:  Head: Normocephalic.  Eyes: Pupils are equal, round, and reactive to light.  Cardiovascular: Normal rate.   Respiratory: Effort normal.  GI: Soft.  Musculoskeletal: Normal range of motion.    Review of Systems  Constitutional:  Negative.   HENT: Negative.   Eyes: Negative.   Respiratory: Negative.   Cardiovascular: Negative.   Gastrointestinal: Negative.   Genitourinary: Negative.   Musculoskeletal: Negative.   Skin: Negative.   Neurological: Negative.   Endo/Heme/Allergies: Negative.   Psychiatric/Behavioral: Positive for depression ("improving") and substance abuse (Hx. alcholism). Negative for hallucinations and suicidal ideas. The patient is nervous/anxious ("Improving") and has insomnia ("Improving").     Blood pressure 132/85, pulse 95, temperature 98.5 F (36.9 C), resp. rate 16, height '5\' 7"'$  (1.702 m), weight 116.6 kg (257 lb), last menstrual period 08/07/2011.Body mass index is 40.25 kg/m.   General Appearance: Fairly Groomed  Eye Contact:  Fair  Speech:  Clear and Coherent  Volume:  Normal  Mood:  Depressed  Affect:  Appropriate  Thought Process:  Goal Directed and Descriptions of Associations: Circumstantial  Orientation:  Full (Time, Place, and Person)  Thought Content:  Rumination  Suicidal Thoughts:  S/P slit BL wrist in a suicide attemptcontracts for safety on the unit  Homicidal Thoughts:  No  Memory:  Immediate;   Fair Recent;   Fair Remote;   Fair  Judgement:  Impaired  Insight:  Shallow  Psychomotor Activity:  Decreased  Concentration:  Concentration: Fair and Attention Span: Fair  Recall:  [employer] of Knowledge:  Fair  Language:  Fair  Akathisia:  No  Handed:  Right  AIMS (if indicated):     Assets:  Desire for Improvement  ADL's:  Intact  Cognition:  WNL  Sleep:  Number of Hours: 1.5     Assessment: This is an admission assessment for this 45 year old AA female with hx of Bipolar depression.  Admitted to the [facility] adult unit from the [facility] ED with complaints of suicide attempt by cutting her wrists. During this admission assessments, she reports, "The paramedics brought me to the ED. My son called them. I had slit both my wrists last night. Everything just hit me at once. I have had bad financial situation. I have not paid my mortgage since May of this year. I don't make enough money at my job. The civil court keep leaving notes on my door & at my job. I'm frustrated. I can't find a job in my field of study. I studied social work. I was never having suicidal thoughts prior to last night. I have Bipolar disorder, diagnosed in 2013. Currently under the care of Dr. Darleene Cleaver.. I'm on Latuda & Celexa, Ambien prn, started 2 weeks ago.  Treatment Plan Summary: Daily contact with patient to assess and evaluate symptoms and progress in treatment and Medication management:  Alcohol Withdrawal symptoms: Continue Librium 25 mg prn. High anxiety levels:   Continue Xanax 0.5 mg prn. Agitation: Continue Neurontin 100 mg routinely, hydroxyzine 25 mg prn. Depression: Continue Citalopram 20 mg. HTN: Continue Lisinopril 5 mg daily. Mood control: Continue Latuda 40 mg Q hs. Insomnia: Continue Ambien 10 mg Q hs. - Continue 15 minutes observation for safety concerns - Encouraged to participate in milieu therapy and group therapy counseling sessions and also work with coping skills -  Develop treatment plan to decrease risk of relapse upon discharge and to reduce the need for readmission. -  Psycho-social education regarding relapse prevention and self care. - Health care follow up as needed for medical problems. - Restart home medications where appropriate.  Encarnacion Slates, NP, PMHNP, FNP-BC 11/13/2015, 3:02 PM

## 2015-11-13 NOTE — Progress Notes (Signed)
Patient ID: Shelby Howard, female   DOB: 02/23/1971, 45 y.o.   MRN: [id number]   Pt currently presents with an anxious affect and cooperative behavior. Pt reports to writer that their goal is to "go to groups." Pt states "I went to groups." Pt reports good sleep with current medication regimen. Pt minimizes situation and substance use. Pt interacts positively with one peer tonight, otherwise remains in bed doing a word search.  Pt provided with medications per providers orders. Pt's labs and vitals were monitored throughout the night. Pt supported emotionally and encouraged to express concerns and questions. Pt educated on medications. Pt encouraged to be in milieu and attend all programming.   Pt's safety ensured with 15 minute and environmental checks. Pt currently denies SI/HI and A/V hallucinations. Pt verbally agrees to seek staff if SI/HI or A/VH occurs and to consult with staff before acting on any harmful thoughts. Will continue POC.

## 2015-11-14 DIAGNOSIS — F329 Major depressive disorder, single episode, unspecified: Secondary | ICD-10-CM

## 2015-11-14 MED ORDER — ADULT MULTIVITAMIN W/MINERALS CH
1.0000 | ORAL_TABLET | Freq: Every day | ORAL | Status: DC
  Administered 2015-11-15: 1 via ORAL
  Filled 2015-11-14 (×3): qty 1

## 2015-11-14 MED ORDER — CHLORDIAZEPOXIDE HCL 25 MG PO CAPS: 25.0000 mg | ORAL_CAPSULE | Freq: Every day | ORAL | Status: DC

## 2015-11-14 MED ORDER — CHLORDIAZEPOXIDE HCL 25 MG PO CAPS: 25.0000 mg | ORAL_CAPSULE | Freq: Three times a day (TID) | ORAL | Status: DC

## 2015-11-14 MED ORDER — CHLORDIAZEPOXIDE HCL 25 MG PO CAPS: 25.0000 mg | ORAL_CAPSULE | ORAL | Status: DC

## 2015-11-14 MED ORDER — THIAMINE HCL 100 MG/ML IJ SOLN: 100.0000 mg | Freq: Once | INTRAMUSCULAR | Status: DC

## 2015-11-14 MED ORDER — HYDROXYZINE HCL 25 MG PO TABS
25.0000 mg | ORAL_TABLET | Freq: Four times a day (QID) | ORAL | Status: DC | PRN
  Administered 2015-11-15: 25 mg via ORAL
  Filled 2015-11-14: qty 1

## 2015-11-14 MED ORDER — VITAMIN B-1 100 MG PO TABS
100.0000 mg | ORAL_TABLET | Freq: Every day | ORAL | Status: DC
  Administered 2015-11-15: 100 mg via ORAL
  Filled 2015-11-14 (×3): qty 1

## 2015-11-14 MED ORDER — ONDANSETRON 4 MG PO TBDP
4.0000 mg | ORAL_TABLET | Freq: Four times a day (QID) | ORAL | Status: DC | PRN
  Administered 2015-11-14: 4 mg via ORAL
  Filled 2015-11-14: qty 1

## 2015-11-14 MED ORDER — CHLORDIAZEPOXIDE HCL 25 MG PO CAPS
25.0000 mg | ORAL_CAPSULE | Freq: Four times a day (QID) | ORAL | Status: DC | PRN
  Administered 2015-11-15: 25 mg via ORAL
  Filled 2015-11-14: qty 1

## 2015-11-14 MED ORDER — CHLORDIAZEPOXIDE HCL 25 MG PO CAPS
25.0000 mg | ORAL_CAPSULE | Freq: Four times a day (QID) | ORAL | Status: DC
  Administered 2015-11-14 – 2015-11-15 (×3): 25 mg via ORAL
  Filled 2015-11-14 (×3): qty 1

## 2015-11-14 MED ORDER — LOPERAMIDE HCL 2 MG PO CAPS: 2.0000 mg | ORAL_CAPSULE | ORAL | Status: DC | PRN
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
## 2015-11-14 NOTE — Progress Notes (Signed)
Recreation Therapy Notes  Date: 11/14/15 Time: 0930 Location: [address] Group Room  Group Topic: Stress Management  Goal Area(s) Addresses:  Patient will verbalize importance of using healthy stress management.  Patient will identify positive emotions associated with healthy stress management.   Behavioral Response: Engaged  Intervention: Stress Management  Activity :  Progressive Muscle Relaxation.  LRT introduced the stress management technique of progressive muscle relaxation to the patients.  Patients were to follow along as LRT read script to engaged in the technique.  Education: Stress Management, Discharge Planning.   Education Outcome: Acknowledges edcuation/In group clarification offered/Needs additional education  Clinical Observations/Feedback: Pt attended group.   Victorino Sparrow, LRT/CTRS   Ria Comment, Corley Maffeo A 11/14/2015 1:15 PM

## 2015-11-14 NOTE — BHH Group Notes (Signed)
[place] LCSW Group Therapy 11/14/2015  1:15 pm  Type of Therapy: Group Therapy Participation Level: Active  Participation Quality: Attentive, Sharing and Supportive  Affect: anxious,  blunted  Cognitive: Alert and Oriented  Insight: Developing/Improving and Engaged  Engagement in Therapy: Developing/Improving and Engaged  Modes of Intervention: Clarification, Confrontation, Discussion, Education, Exploration,  Limit-setting, Orientation, Problem-solving, Rapport Building, Art therapist, Socialization and Support  Summary of Progress/Problems: Pt identified obstacles faced currently and processed barriers involved in overcoming these obstacles. Pt identified steps necessary for overcoming these obstacles and explored motivation (internal and external) for facing these difficulties head on. Pt further identified one area of concern in their lives and chose a goal to focus on for today. Patient did not participate in discussion despite encouragement.  Tilden Fossa, LCSW Clinical Social Worker [facility] [phone]

## 2015-11-14 NOTE — Progress Notes (Addendum)
Patient ID: Shelby Howard, female   DOB: Oct 08, 1970, 45 y.o.   MRN: [id number] [facility] MD Progress Note  11/14/2015 4:29 PM Shelby Howard  MRN:  [id number]  Subjective:  She states that she feels better today. She reports that he found herself in the bathtab, cutting her wrist after taking Xanax to sleep. She was scared of her act and called her mother who called EMS. She denies any previous suicide attempt. She reports she has been stressed financially. She takes Xanax 1-2 mg three times per week for anxiety. She drinks 3 bottles of wine per week. She denies any withdrawal symptoms. She wants to be discharged to live with her son, age 73. She is hoping to go to college to get master degree.   Principal Problem: Depression  Diagnosis:   Patient Active Problem List   Diagnosis Date Noted  . Alcohol use disorder, mild, abuse [F10.10] 11/12/2015  . Bipolar I disorder, most recent episode depressed ([place]) [F31.30] 11/12/2015  . Fibroids [D25.9]   . Bipolar disorder ([place]) [F31.9]   . Anxiety [F41.9]   . Depression [F32.9]   . Insomnia [G47.00]   . 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 Anemia    history  . Anxiety   . Bipolar disorder ([place])   . Depression   . Endocervical adenocarcinoma ([place])   . Fibroids   . Insomnia     Past Surgical History:  Procedure Laterality Date  . CERVICAL CONIZATION W/BX  03/02/2011   Procedure: CONIZATION CERVIX WITH BIOPSY;  Surgeon: Betsy Coder, MD;  Location: [facility];  Service: Gynecology;  Laterality: N/A;  Cold Knife Conization, Endocervical Curretage  . CYSTOSCOPY  09/06/2011   Procedure: CYSTOSCOPY;  Surgeon: Betsy Coder, MD;  Location: [facility];  Service: Gynecology;  Laterality: N/A;  . DILATION AND CURETTAGE OF UTERUS  09/2007   resection of polyp  . LAPAROSCOPIC HYSTERECTOMY  09/06/2011   Procedure: HYSTERECTOMY TOTAL LAPAROSCOPIC;  Surgeon: Betsy Coder, MD;  Location: [facility];  Service: Gynecology;  Laterality: N/A;  . SVD  1998   x 1   Family History:  Family History  Problem Relation Age of Onset  . Hypertension Mother   . Bipolar disorder Mother   . Bipolar disorder Sister    Family Psychiatric  History: See Md's SRA  Social History:  History  Alcohol Use  . 2.4 oz/week  . 4 Glasses of wine per week    Comment: weekly -wine     History  Drug Use No    Social History   Social History  . Marital status: Divorced    Spouse name: N/A  . Number of children: N/A  . Years of education: N/A   Social History Main Topics  . Smoking status: Never Smoker  . Smokeless tobacco: Never Used  . Alcohol use 2.4 oz/week    4 Glasses of wine per week     Comment: weekly -wine  . Drug use: No  . Sexual activity: No   Other Topics Concern  . None   Social History Narrative  . None   Additional Social History:    Pain Medications: n/a Prescriptions: n/a Over the Counter: n/a History of alcohol / drug use?: Yes Name of Substance 1: Alcohol  1 - Age  of First Use: 17 1 - Amount (size/oz): 1 glass of wine  1 - Frequency: 3-4x weekly  1 - Duration: ongoing 1 - Last Use / Amount: 11-11-15  Sleep: Fair  Appetite:  Fair  Current Medications: Current Facility-Administered Medications  Medication Dose Route Frequency Provider Last Rate Last Dose  . acetaminophen (TYLENOL) tablet 650 mg  650 mg Oral Q6H PRN Jenne Campus, MD      . alum & mag hydroxide-simeth (MAALOX/MYLANTA) 200-200-20 MG/5ML suspension 30 mL  30 mL Oral Q4H PRN Myer Peer Cobos, MD      . chlordiazePOXIDE (LIBRIUM) capsule 25 mg  25 mg Oral QID PRN Encarnacion Slates, NP      . citalopram (CELEXA) tablet 20 mg  20 mg Oral Daily Encarnacion Slates, NP   20 mg at 11/14/15 0748  . gabapentin (NEURONTIN) capsule 100 mg  100 mg Oral TID  Encarnacion Slates, NP   100 mg at 11/14/15 1212  . hydrOXYzine (ATARAX/VISTARIL) tablet 25 mg  25 mg Oral Q4H PRN Jenne Campus, MD   25 mg at 11/12/15 0403  . ibuprofen (ADVIL,MOTRIN) tablet 600 mg  600 mg Oral Q6H PRN Encarnacion Slates, NP      . lisinopril (PRINIVIL,ZESTRIL) tablet 5 mg  5 mg Oral Daily Encarnacion Slates, NP   5 mg at 11/14/15 0748  . lurasidone (LATUDA) tablet 40 mg  40 mg Oral Q breakfast Encarnacion Slates, NP   40 mg at 11/14/15 0748  . magnesium hydroxide (MILK OF MAGNESIA) suspension 30 mL  30 mL Oral Daily PRN Jenne Campus, MD      . zolpidem (AMBIEN) tablet 10 mg  10 mg Oral QHS PRN Encarnacion Slates, NP   10 mg at 11/13/15 2221   Lab Results:  No results found for this or any previous visit (from the past 48 hour(s)). Blood Alcohol level:  Lab Results  Component Value Date   ETH 226 (H) 99991111   Metabolic Disorder Labs: No results found for: HGBA1C, MPG No results found for: PROLACTIN No results found for: CHOL, TRIG, HDL, CHOLHDL, VLDL, LDLCALC  Physical Findings: AIMS: Facial and Oral Movements Muscles of Facial Expression: None, normal Lips and Perioral Area: None, normal Jaw: None, normal Tongue: None, normal,Extremity Movements Upper (arms, wrists, hands, fingers): None, normal Lower (legs, knees, ankles, toes): None, normal, Trunk Movements Neck, shoulders, hips: None, normal, Overall Severity Severity of abnormal movements (highest score from questions above): None, normal Incapacitation due to abnormal movements: None, normal Patient's awareness of abnormal movements (rate only patient's report): No Awareness, Dental Status Current problems with teeth and/or dentures?: No Does patient usually wear dentures?: No  CIWA:  CIWA-Ar Total: 0 COWS:  COWS Total Score: 1  Musculoskeletal: Strength & Muscle Tone: within normal limits Gait & Station: normal Patient leans: N/A  Psychiatric Specialty Exam: Physical Exam  Constitutional: She appears  well-developed and well-nourished.  HENT:  Head: Normocephalic.  Eyes: Pupils are equal, round, and reactive to light.  Cardiovascular: Normal rate.   Respiratory: Effort normal.  GI: Soft.  Musculoskeletal: Normal range of motion.    Review of Systems  Constitutional: Negative.   HENT: Negative.   Eyes: Negative.   Respiratory: Negative.   Cardiovascular: Negative.   Gastrointestinal: Negative.   Genitourinary: Negative.   Musculoskeletal: Negative.   Skin: Negative.   Neurological: Negative.   Endo/Heme/Allergies: Negative.   Psychiatric/Behavioral: Positive for depression ("improving") and substance abuse (Hx. alcholism). Negative  for hallucinations and suicidal ideas. The patient is nervous/anxious ("Improving"). The patient does not have insomnia ("Improving").     Blood pressure 139/78, pulse 100, temperature 98.7 F (37.1 C), temperature source Oral, resp. rate 12, height 5\' 7"  (1.702 m), weight 257 lb (116.6 kg), last menstrual period 08/07/2011.Body mass index is 40.25 kg/m.  General Appearance: Fairly Groomed  Eye Contact:  Fair  Speech:  Clear and Coherent  Volume:  Normal  Mood:  "better"  Affect:  slightly down, Appropriate  Thought Process:  Goal Directed and Descriptions of Associations: Circumstantial  Orientation:  Full (Time, Place, and Person)  Thought Content:  Rumination  Suicidal Thoughts:  S/P slit BL wrist in a suicide attempt contracts for safety on the unit. Denies current SI  Homicidal Thoughts:  No  Memory:  Immediate;   Fair Recent;   Fair Remote;   Fair  Judgement:  Impaired  Insight:  Shallow  Psychomotor Activity:  Decreased  Concentration:  Concentration: Fair and Attention Span: Fair  Recall:  [employer] of Knowledge:  Fair  Language:  Fair  Akathisia:  No  Handed:  Right  AIMS (if indicated):     Assets:  Desire for Improvement  ADL's:  Intact  Cognition:  WNL  Sleep:  Number of Hours: 1.5     Assessment:  45 year old AA  female with self report hx of Bipolar depression, hypertension, anemia, admitted to the [facility] adult unit from the [facility] ED with complaints of suicide attempt by cutting her wrists in the setting of financial strain. UDS positive for benzodiazepine only.   # MDD Patient reports neurovegetative symptoms with SI preceding to this admission. Although she adamantly denies any SI when she cut her wrists, she did tell to other providers that she had SI at that time. Will continue citalopram to target her mood with adjunctive treatment with Latuda. Will continue Neurontin for anxiety; will discontinue Xanax given it has risk of dependence. Noted that she denies any significant symptoms consistent with mania; will continue to monitor her mood.   Treatment Plan Summary: Daily contact with patient to assess and evaluate symptoms and progress in treatment and Medication management:  - Continue Citalopram 20 mg - Continue Latuda 40 mg Q hs. - Continue Neurontin 100 mg TID - Ambien 10 mg qhsprn for insomnia - Discontinue Xanax - Continue hydroxyzine 25 mg prn for anxiety - Discontinue librium given lack of withdrawal symptoms - Continue 15 minutes observation for safety concerns - Encouraged to participate in milieu therapy and group therapy counseling sessions and also work with coping skills -  Develop treatment plan to decrease risk of relapse upon discharge and to reduce the need for readmission. -  Psycho-social education regarding relapse prevention and self care. - Health care follow up as needed for medical problems. - Restart home medications where appropriate.  Norman Clay, MD, 11/14/2015, 4:29 PM

## 2015-11-14 NOTE — Progress Notes (Signed)
D: Pt presented anxious during shift assessment this morning. Pt focused on discharging home so that she can return to work. Pt appeared to be minimizing her symptoms and substance abuse. Pt denied withdrawal symptoms this morning. Pt denied suicidal thoughts. Pt denied depression and anxiety. Pt goal today is to meet with the social worker and doctor to discus discharge plans. MD aware of pt request to discharge.  A: Medications reviewed with pt. Medications administered as ordered per MD. Verbal support provided. Pt encouraged to attend groups. 15 minute checks performed for safety. R: Pt compliant with tx. Pt verbalized understanding of med regimen.

## 2015-11-14 NOTE — Progress Notes (Signed)
Pt was complaining of having withdrawal Sx. Pt was put on Librium protocol by PA due to pt labs and pt complaints.

## 2015-11-15 MED ORDER — ZOLPIDEM TARTRATE 10 MG PO TABS: 10.0000 mg | ORAL_TABLET | Freq: Every evening | ORAL | 0 refills | Status: AC | PRN

## 2015-11-15 MED ORDER — IBUPROFEN 600 MG PO TABS: 600.0000 mg | ORAL_TABLET | Freq: Four times a day (QID) | ORAL | 0 refills | Status: DC | PRN

## 2015-11-15 MED ORDER — LURASIDONE HCL 40 MG PO TABS: 40.0000 mg | ORAL_TABLET | Freq: Every day | ORAL | 0 refills | Status: AC

## 2015-11-15 MED ORDER — CITALOPRAM HYDROBROMIDE 20 MG PO TABS: 20.0000 mg | ORAL_TABLET | Freq: Every day | ORAL | 0 refills | Status: DC

## 2015-11-15 MED ORDER — LISINOPRIL 5 MG PO TABS: 5.0000 mg | ORAL_TABLET | Freq: Every day | ORAL | 0 refills | Status: DC

## 2015-11-15 MED ORDER — GABAPENTIN 100 MG PO CAPS: 100.0000 mg | ORAL_CAPSULE | Freq: Three times a day (TID) | ORAL | 0 refills | Status: DC

## 2015-11-15 NOTE — Discharge Summary (Signed)
Physician Discharge Summary Note  Patient:  Shelby Howard is an 45 y.o., female MRN:  [id number] DOB:  11-20-1970 Patient phone:  [phone] (home)  Patient address:   [place] [address],  Total Time spent with patient: Greater than 30 minutes  Date of Admission:  11/12/2015  Date of Discharge: 11-15-15  Reason for Admission: Suicide attempt by wrists cutting.  Principal Problem: Bipolar 1 disorder, most recent episode, depressed.  Discharge Diagnoses: Patient Active Problem List   Diagnosis Date Noted  . Alcohol use disorder, mild, abuse [F10.10] 11/12/2015  . Bipolar I disorder, most recent episode depressed ([place]) [F31.30] 11/12/2015  . Fibroids [D25.9]   . Bipolar disorder ([place]) [F31.9]   . Anxiety [F41.9]   . Depression [F32.9]   . Insomnia [G47.00]   . 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  Past Medical History:  Past Medical History:  Diagnosis Date  . Anemia    history  . Anxiety   . Bipolar disorder ([place])   . Depression   . Endocervical adenocarcinoma ([place])   . Fibroids   . Insomnia     Past Surgical History:  Procedure Laterality Date  . CERVICAL CONIZATION W/BX  03/02/2011   Procedure: CONIZATION CERVIX WITH BIOPSY;  Surgeon: Betsy Coder, MD;  Location: [facility];  Service: Gynecology;  Laterality: N/A;  Cold Knife Conization, Endocervical Curretage  . CYSTOSCOPY  09/06/2011   Procedure: CYSTOSCOPY;  Surgeon: Betsy Coder, MD;  Location: [facility];  Service: Gynecology;  Laterality: N/A;  . DILATION AND CURETTAGE OF UTERUS  09/2007   resection of polyp  . LAPAROSCOPIC HYSTERECTOMY  09/06/2011   Procedure: HYSTERECTOMY TOTAL LAPAROSCOPIC;  Surgeon: Betsy Coder, MD;  Location: [facility];  Service: Gynecology;  Laterality: N/A;  . SVD  1998   x 1   Family History:  Family History  Problem Relation Age of Onset   . Hypertension Mother   . Bipolar disorder Mother   . Bipolar disorder Sister    Family Psychiatric  History: See H&P  Social History:  History  Alcohol Use  . 2.4 oz/week  . 4 Glasses of wine per week    Comment: weekly -wine     History  Drug Use No    Social History   Social History  . Marital status: Divorced    Spouse name: N/A  . Number of children: N/A  . Years of education: N/A   Social History Main Topics  . Smoking status: Never Smoker  . Smokeless tobacco: Never Used  . Alcohol use 2.4 oz/week    4 Glasses of wine per week     Comment: weekly -wine  . Drug use: No  . Sexual activity: No   Other Topics Concern  . None   Social History Narrative  . None   Hospital Course: This is an admission assessment for this 45 year old AA female with hx of Bipolar depression.  Admitted to the [facility] adult unit from the [facility] ED with complaints of suicide attempt by cutting her wrists. During this admission assessments, she reports, "The paramedics brought me to the ED. My son called them. I had slit both my wrists last night. Everything just hit me at once. I have had bad financial situation. I have not paid my mortgage since May of this year. I don't make enough money at my  job. The civil court keep leaving notes on my door & at my job. I'm frustrated. I can't find a job in my field of study. I studied social work. I was never having suicidal thoughts prior to last night. I have Bipolar disorder, diagnosed in 2013. Currently under the care of Dr. Darleene Cleaver.. I'm on Latuda & Celexa, Ambien prn, started 2 weeks ago. I was cleaning the house last night, when I keep seeing & finding these unpaid bills. That was when I snapped. I don't use drugs, except the one prescribed by my doctor. But, I do drink alcohol at night after to work, just to unwind".  Shelby Howard was admitted to the hospital with her UDS reports positive for Benzodiazepine & a BAL of 226. However, her reason  for admission was due to suicide attempt by wrist cutting. She reported worsening symptoms of depression triggered by financial difficulty & inability to pay her mortgage. She was afraid she was about to lose her home. After evaluation of her presenting symptoms, Shelby Howard  received Librium detoxification treatment protocols for Benzodiazepine & alcohol detox. She was also enrolled and participated in the group counseling sessions and AA/NA meetings being offered and held on this unit. She learned coping skills.  Besides the detox treatment, Shelby Howard was also medicated & discharged on; Gabapentin 100 mg for agitation/substance withdrawal syndrome, Citalopram 20 mg for depression, Ambien 10 for insomnia & Latuda 40 mg for mood control. She also received other medication management for the other medical issues that she presented. She tolerated her treatment regimen without any adverse effects.  Shelby Howard has completed detox treatment and her mood is stable. This is evidenced by her reports of improved mood and absence of substance withdrawal symptoms. She is currently being discharged to continue mental health treatement as noted below on an outpatient basis. Upon discharge, she adamantly denies any SIHI, AVH, delusional thoughts, paranoia and or substance withdrawal symptoms. She left with all belongings in no apparent distress. Transportation per friend.  Physical Findings: AIMS: Facial and Oral Movements Muscles of Facial Expression: None, normal Lips and Perioral Area: None, normal Jaw: None, normal Tongue: None, normal,Extremity Movements Upper (arms, wrists, hands, fingers): None, normal Lower (legs, knees, ankles, toes): None, normal, Trunk Movements Neck, shoulders, hips: None, normal, Overall Severity Severity of abnormal movements (highest score from questions above): None, normal Incapacitation due to abnormal movements: None, normal Patient's awareness of abnormal movements (rate only patient's  report): No Awareness, Dental Status Current problems with teeth and/or dentures?: No Does patient usually wear dentures?: No  CIWA:  CIWA-Ar Total: 5 COWS:  COWS Total Score: 1  Musculoskeletal: Strength & Muscle Tone: within normal limits Gait & Station: normal Patient leans: N/A  Psychiatric Specialty Exam: Physical Exam  Constitutional: She appears well-developed.  HENT:  Head: Normocephalic.  Eyes: Pupils are equal, round, and reactive to light.  Neck: Normal range of motion.  Cardiovascular: Normal rate.   Respiratory: Effort normal.  GI: Soft.  Genitourinary:  Genitourinary Comments: Denies any issues in the area  Musculoskeletal: Normal range of motion.  Neurological: She is alert.  Skin: Skin is warm.    Review of Systems  Constitutional: Negative.   HENT: Negative.   Eyes: Negative.   Respiratory: Negative.   Cardiovascular: Negative.   Gastrointestinal: Negative.   Genitourinary: Negative.   Musculoskeletal: Negative.   Skin: Negative.     Blood pressure 138/89, pulse 94, temperature 98.3 F (36.8 C), temperature source Oral, resp. rate 16, height 5\' 7"  (1.702  m), weight 116.6 kg (257 lb), last menstrual period 08/07/2011.Body mass index is 40.25 kg/m.  See Md's SRA   Have you used any form of tobacco in the last 30 days? (Cigarettes, Smokeless Tobacco, Cigars, and/or Pipes): No  Has this patient used any form of tobacco in the last 30 days? (Cigarettes, Smokeless Tobacco, Cigars, and/or Pipes): No  Blood Alcohol level:  Lab Results  Component Value Date   ETH 226 (H) 99991111   Metabolic Disorder Labs:  No results found for: HGBA1C, MPG No results found for: PROLACTIN No results found for: CHOL, TRIG, HDL, CHOLHDL, VLDL, LDLCALC  See Psychiatric Specialty Exam and Suicide Risk Assessment completed by Attending Physician prior to discharge.  Discharge destination:  Home  Is patient on multiple antipsychotic therapies at discharge:  No   Has  Patient had three or more failed trials of antipsychotic monotherapy by history:  No  Recommended Plan for Multiple Antipsychotic Therapies: NA    Medication List    STOP taking these medications   ALPRAZolam 1 MG tablet Commonly known as:  XANAX   zaleplon 5 MG capsule Commonly known as:  SONATA     TAKE these medications     Indication  citalopram 20 MG tablet Commonly known as:  CELEXA Take 1 tablet (20 mg total) by mouth daily. For depression What changed:  how much to take  additional instructions  Indication:  Depression   gabapentin 100 MG capsule Commonly known as:  NEURONTIN Take 1 capsule (100 mg total) by mouth 3 (three) times daily. For agitation  Indication:  Agitation   ibuprofen 600 MG tablet Commonly known as:  ADVIL,MOTRIN Take 1 tablet (600 mg total) by mouth every 6 (six) hours as needed. For headache/Cramps  Indication:  Headaches, cramps   lisinopril 5 MG tablet Commonly known as:  PRINIVIL,ZESTRIL Take 1 tablet (5 mg total) by mouth daily. For high blood pressure What changed:  additional instructions  Indication:  High Blood Pressure Disorder   lurasidone 40 MG Tabs tablet Commonly known as:  LATUDA Take 1 tablet (40 mg total) by mouth daily with breakfast. For mood control  Indication:  Mood control   zolpidem 10 MG tablet Commonly known as:  AMBIEN Take 1 tablet (10 mg total) by mouth at bedtime as needed for sleep.  Indication:  [place] Follow up on 11/29/2015.   Why:  Medication management appt with Dr. Darleene Cleaver on Tuesday Sept. 5th at 1:30pm. Ask about referral for therapy if interested.  Contact information: [address] [place], [place] [address] [phone]         Follow-up recommendations:  Activity:  As tolerated Diet: As recommended by your primary care doctor. Keep all scheduled follow-up appointments as recommended.  Comments: Patient  is instructed prior to discharge to: Take all medications as prescribed by his/her mental healthcare provider. Report any adverse effects and or reactions from the medicines to his/her outpatient provider promptly. Patient has been instructed & cautioned: To not engage in alcohol and or illegal drug use while on prescription medicines. In the event of worsening symptoms, patient is instructed to call the crisis hotline, 911 and or go to the nearest ED for appropriate evaluation and treatment of symptoms. To follow-up with his/her primary care provider for your other medical issues, concerns and or health care needs.   Signed: Encarnacion Slates, NP, PMHNP, FNP-BC 11/16/2015, 4:44 PM

## 2015-11-15 NOTE — Progress Notes (Signed)
  [facility] Adult Case Management Discharge Plan :  Will you be returning to the same living situation after discharge:  Yes,  patient plans to return home At discharge, do you have transportation home?: Yes,  friends/family  Do you have the ability to pay for your medications: Yes,  patient will be provided with prescriptions at discharge  Release of information consent forms completed and in the chart;  Patient's signature needed at discharge.  Patient to Follow up at: Follow-up [place] Follow up on 11/29/2015.   Why:  Medication management appt with Dr. Darleene Cleaver on Tuesday Sept. 5th at 1:30pm. Ask about referral for therapy if interested.  Contact information: [address] [place], [place] [address] [phone]          Next level of care provider has access to [place] and Suicide Prevention discussed: Yes,  with patient and sister  Have you used any form of tobacco in the last 30 days? (Cigarettes, Smokeless Tobacco, Cigars, and/or Pipes): No  Has patient been referred to the Quitline?: N/A patient is not a smoker  Patient has been referred for addiction treatment: Yes  Shelby Howard 11/15/2015, 10:33 AM

## 2015-11-15 NOTE — Progress Notes (Signed)
Pt discharged home with the family member. Pt was ambulatory, stable and appreciative at that time. All papers and prescriptions were given and valuables returned. Verbal understanding expressed. Denies SI/HI and A/VH. Pt given opportunity to express concerns and ask questions.

## 2015-11-15 NOTE — Tx Team (Signed)
Interdisciplinary Treatment Plan Update (Adult) Date: 11/15/2015   Time Reviewed: 9:30 AM  Progress in Treatment: Attending groups: Yes Participating in groups: Minimally Taking medication as prescribed: Yes Tolerating medication: Yes Family/Significant other contact made: Yes, CSW has spoken with patient's sister Patient understands diagnosis: Yes Discussing patient identified problems/goals with staff: Yes Medical problems stabilized or resolved: Yes Denies suicidal/homicidal ideation: Yes Issues/concerns per patient self-inventory: Yes Other:  New problem(s) identified: N/A  Discharge Plan or Barriers: Home with outpatient services.     Reason for Continuation of Hospitalization:  Depression Anxiety Medication Stabilization   Comments: N/A  Estimated length of stay: Discharge anticipated for today 11/15/15  Patient is a 45 year old female who presented to the hospital with depression, alcohol abuse, and suicide attempt by cutting her wrists. Primary triggers for admission include financial and employment stressors. Patient will benefit from crisis stabilization, medication evaluation, group therapy and psycho education in addition to case management for discharge planning. At discharge, it is recommended that Pt remain compliant with established discharge plan and continued treatment.   Review of initial/current patient goals per problem list:  1. Goal(s): Patient will participate in aftercare plan   Met: Yes   Target date: 3-5 days post admission date   As evidenced by: Patient will participate within aftercare plan AEB aftercare provider and housing plan at discharge being identified.  8/22: Goal met. Patient plans to return home to follow up with outpatient services.     2. Goal (s): Patient will exhibit decreased depressive symptoms and suicidal ideations.   Met: Adequate for discharge per MD   Target date: 3-5 days post admission date   As evidenced  by: Patient will utilize self rating of depression at 3 or below and demonstrate decreased signs of depression or be deemed stable for discharge by MD.  8/22: Adequate for discharge per MD. Patient reports improvement in her symptoms and reports feeling safe for discharge.     3. Goal(s): Patient will demonstrate decreased signs and symptoms of anxiety.   Met: Adequate for discharge per MD   Target date: 3-5 days post admission date   As evidenced by: Patient will utilize self rating of anxiety at 3 or below and demonstrated decreased signs of anxiety, or be deemed stable for discharge by MD  8/22: Adequate for discharge per MD. Patient reports improvement in her symptoms and reports feeling safe for discharge.     4. Goal(s): Patient will demonstrate decreased signs of withdrawal due to substance abuse   Met: Yes   Target date: 3-5 days post admission date   As evidenced by: Patient will produce a CIWA/COWS score of 0, have stable vitals signs, and no symptoms of withdrawal   8/22: Goal met. No withdrawal symptoms reported at this time per medical chart.    Attendees: Patient:    Family:    Physician: Dr. Modesta Messing 11/15/2015 9:30 AM  Nursing: Elesa Massed, Eulogio Bear , RN 11/15/2015 9:30 AM  Clinical Social Worker: Tilden Fossa, LCSW 11/15/2015 9:30 AM  Other: Peri Maris, LCSW 11/15/2015 9:30 AM  Other:  11/15/2015 9:30 AM  Other:  11/15/2015 9:30 AM  Other:  May Malachi Carl, NP 11/15/2015 9:30 AM  Other:      Scribe for Treatment Team:  Tilden Fossa, [place]

## 2015-11-15 NOTE — BHH Suicide Risk Assessment (Addendum)
[facility] Discharge Suicide Risk Assessment   Principal Problem: Depression Discharge Diagnoses:  Patient Active Problem List   Diagnosis Date Noted  . Alcohol use disorder, mild, abuse [F10.10] 11/12/2015  . Bipolar I disorder, most recent episode depressed ([place]) [F31.30] 11/12/2015  . Fibroids [D25.9]   . Bipolar disorder ([place]) [F31.9]   . Anxiety [F41.9]   . Depression [F32.9]   . Insomnia [G47.00]   . 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  Cardiovascular: Negative for palpitations.  Neurological: Negative for dizziness, tremors and weakness.  Psychiatric/Behavioral: Negative for depression, hallucinations, substance abuse and suicidal ideas. The patient is not nervous/anxious and does not have insomnia.   All other systems reviewed and are negative.   Blood pressure 138/89, pulse 94, temperature 98.3 F (36.8 C), temperature source Oral, resp. rate 16, height 5\' 7"  (1.702 m), weight 257 lb (116.6 kg), last menstrual period 08/07/2011.Body mass index is 40.25 kg/m.  General Appearance: Casual  Eye Contact::  Good  Speech:  Normal Rate409  Volume:  Normal  Mood:  "great"  Affect:  Appropriate  Thought Process:  Coherent and Goal Directed  Orientation:  Full (Time, Place, and Person)  Thought Content:  Logical Perceptions: denies AH/VH  Suicidal Thoughts:  No  Homicidal Thoughts:  No  Memory:  Negative  Judgement:  Good  Insight:  Fair  Psychomotor Activity:  Normal  Concentration:  Good  Recall:  Good  Fund of Knowledge:Good  Language: Good  Akathisia:  No  Handed:  Right  AIMS (if indicated):     Assets:  Communication Skills Desire for  Improvement  Sleep:  Number of Hours: 3.75  Cognition: WNL  ADL's:  Intact   Mental Status Per Nursing Assessment::   On Admission:  Suicidal ideation indicated by patient  Demographic Factors:  Female and Divorced or widowed  Loss Factors: NA  Historical Factors: Prior suicide attempts and Family history of mental illness or substance abuse Mother- bipolar disorder  Sister-   Bipolar disorder Risk Reduction Factors:   Sense of responsibility to family, Religious beliefs about death, Employed, Positive social support and Positive therapeutic relationship  Continued Clinical Symptoms:  Depression:   Impulsivity  Cognitive Features That Contribute To Risk:  None    Suicide Risk:  Mild:  Suicidal ideation of limited frequency, intensity, duration, and specificity.  There are no identifiable plans, no associated intent, mild dysphoria and related symptoms, good self-control (both objective and subjective assessment), few other risk factors, and identifiable protective factors, including available and accessible social support.  Follow-up [place] Follow up on 11/29/2015.   Why:  Medication management appt with Dr. Darleene Cleaver on Tuesday Sept. 5th at 1:30pm. Ask about referral for therapy if interested.  Contact information: [address], [place] [address] [phone]         She had a nausea/vomiting and diarrhea last night and Librium protocol was started. She denies any tremors, diaphoresis, palpitation last night. It is deemed less likely to be benzodiazepine withdrawal given her symptoms. No benzodiazepine tapering is required. Informed patient about withdrawal symptoms and advised to seek medical treatment if she were to develop any signes.   Patient is  future oriented, denies SI. She agrees with follow up appointment as above.  Plan Of Care/Follow-up recommendations:  Activity:  regular Diet:  regular Tests:  none Other:   n/a  Norman Clay, MD 11/15/2015, 10:32 AM

## 2015-11-15 NOTE — BHH Suicide Risk Assessment (Signed)
[place] INPATIENT:  Family/Significant Other Suicide Prevention Education  Suicide Prevention Education:  Education Completed; sister Shelby Howard [phone],  (name of family member/significant other) has been identified by the patient as the family member/significant other with whom the patient will be residing, and identified as the person(s) who will aid the patient in the event of a mental health crisis (suicidal ideations/suicide attempt).  With written consent from the patient, the family member/significant other has been provided the following suicide prevention education, prior to the and/or following the discharge of the patient.  The suicide prevention education provided includes the following:  Suicide risk factors  Suicide prevention and interventions  National Suicide Hotline telephone number  [facility] assessment telephone number  [facility] Emergency Assistance [place] and/or Residential Mobile Crisis Unit telephone number  Request made of family/significant other to:  Remove weapons (e.g., guns, rifles, knives), all items previously/currently identified as safety concern.    Remove drugs/medications (over-the-counter, prescriptions, illicit drugs), all items previously/currently identified as a safety concern.  The family member/significant other verbalizes understanding of the suicide prevention education information provided.  The family member/significant other agrees to remove the items of safety concern listed above.  Shelby Howard Shelby Howard 11/15/2015, 10:27 AM

## 2015-11-26 ENCOUNTER — Ambulatory Visit (INDEPENDENT_AMBULATORY_CARE_PROVIDER_SITE_OTHER): Payer: BLUE CROSS/BLUE SHIELD | Admitting: Physician Assistant

## 2015-11-26 VITALS — BP 130/80 | HR 91 | Temp 98.7°F | Resp 18 | Ht 67.0 in | Wt 256.4 lb

## 2015-11-26 DIAGNOSIS — Z4802 Encounter for removal of sutures: Secondary | ICD-10-CM

## 2015-11-26 NOTE — Progress Notes (Addendum)
Patient ID: Shelby Howard, female   DOB: 1970/09/10, 45 y.o.   MRN: [id number] [facility] [address] [place] [address] [phone]  Date:  11/26/2015   Name:  Shelby Howard   DOB:  03/31/1970   MRN:  [id number]  PCP:  Betsy Coder, MD   By signing my name below, I, Shelby Howard, attest that this documentation has been prepared under the direction and in the presence of Shelby Drape, PA-C Electronically Signed: [name], ED Scribe 11/26/2015 at 2:54 PM.  Chief Complaint  Patient presents with   Suture / Staple Removal    from hospital visit on 11/11/15 (both wrist)    History of Present Illness:  Shelby Howard is a 45 y.o. female patient, with a h/o anxiety, depression, bipolar disorder, who presents to [facility] for suture removal from both wrists. Pt was seen in the ED on 11/12/15 and had 6 sutures placed s/p attempted suicide via slitting both of he wrists. She presents to the office today to have the sutures removed.  She denies any fever pain swelling pus at the areas. She has been keeping them clean and bandaged. She is following up with her psych providers. She denies any suicidal or homicidal ideations. She is taking her medication as prescribed. 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[place])    Fibroids    Insomnia     Past Surgical History:  Procedure Laterality Date   CERVICAL CONIZATION W/BX  03/02/2011   Procedure: CONIZATION CERVIX WITH BIOPSY;  Surgeon: Betsy Coder, MD;   Location: [facility];  Service: Gynecology;  Laterality: N/A;  Cold Knife Conization, Endocervical Curretage   CYSTOSCOPY  09/06/2011   Procedure: CYSTOSCOPY;  Surgeon: Betsy Coder, MD;  Location: [facility];  Service: Gynecology;  Laterality: N/A;   DILATION AND CURETTAGE OF UTERUS  09/2007   resection of polyp   LAPAROSCOPIC HYSTERECTOMY  09/06/2011   Procedure: HYSTERECTOMY TOTAL LAPAROSCOPIC;  Surgeon: Betsy Coder, MD;  Location: [facility];  Service: Gynecology;  Laterality: N/A;   SVD  1998   x 1    Social History  Substance Use Topics   Smoking status: Never Smoker   Smokeless tobacco: Never Used   Alcohol use 2.4 oz/week    4 Glasses of wine per week     Comment: weekly -wine    Family History  Problem Relation Age of Onset   Hypertension Mother    Bipolar disorder Mother    Bipolar disorder Sister     No Known Allergies  Medication list has been reviewed and updated.  Current Outpatient Prescriptions on File Prior to Visit  Medication Sig Dispense Refill   citalopram (CELEXA) 20 MG tablet Take 1 tablet (20 mg total) by mouth daily. For depression 30 tablet 0   gabapentin (NEURONTIN) 100 MG capsule Take 1 capsule (100 mg total) by mouth 3 (three) times daily. For agitation 30 capsule 0  ibuprofen (ADVIL,MOTRIN) 600 MG tablet Take 1 tablet (600 mg total) by mouth every 6 (six) hours as needed. For headache/Cramps 1 tablet 0   lisinopril (PRINIVIL,ZESTRIL) 5 MG tablet Take 1 tablet (5 mg total) by mouth daily. For high blood pressure 30 tablet 0   lurasidone (LATUDA) 40 MG TABS tablet Take 1 tablet (40 mg total) by mouth daily with breakfast. For mood control 30 tablet 0   zolpidem (AMBIEN) 10 MG tablet Take 1 tablet (10 mg total) by mouth at bedtime as needed for sleep. 7 tablet 0   No current facility-administered medications on file prior to visit.     Review of Systems  Skin:       + Wound (both wrists)    Physical Examination: BP 130/80    Pulse  91    Temp 98.7 F (37.1 C) (Oral)    Resp 18    Ht 5\' 7"  (1.702 m)    Wt 256 lb 6 oz (116.3 kg)    LMP 08/07/2011    SpO2 100%    BMI 40.15 kg/m  Ideal Body Weight: @FLOWAMB [id number]  Physical Exam  Constitutional: She is oriented to person, place, and time. She appears well-developed and well-nourished. No distress.  HENT:  Head: Normocephalic and atraumatic.  Right Ear: External ear normal.  Left Ear: External ear normal.  Eyes: Conjunctivae and EOM are normal. Pupils are equal, round, and reactive to light.  Cardiovascular: Normal rate.   Pulmonary/Chest: Effort normal. No respiratory distress.  Neurological: She is alert and oriented to person, place, and time.  Skin: She is not diaphoretic.  Psychiatric: She has a normal mood and affect. Her behavior is normal.    Assessment and Plan: Shelby Howard is a 45 y.o. female who is here today for suture removal She appears stable at this time.  Following up with psychiatrist. Visit for suture removal  Shelby Drape, PA-C [facility] 11/26/2015 2:45 PM

## 2015-11-26 NOTE — Patient Instructions (Addendum)
  Keep the wound cleansed with soap and water. Suture Removal, Care After Refer to this sheet in the next few weeks. These instructions provide you with information on caring for yourself after your procedure. Your health care provider may also give you more specific instructions. Your treatment has been planned according to current medical practices, but problems sometimes occur. Call your health care provider if you have any problems or questions after your procedure. WHAT TO EXPECT AFTER THE PROCEDURE After your stitches (sutures) are removed, it is typical to have the following:  Some discomfort and swelling in the wound area.  Slight redness in the area. HOME CARE INSTRUCTIONS   If you have skin adhesive strips over the wound area, do not take the strips off. They will fall off on their own in a few days. If the strips remain in place after 14 days, you may remove them.  Change any bandages (dressings) at least once a day or as directed by your health care provider. If the bandage sticks, soak it off with warm, soapy water.  Apply cream or ointment only as directed by your health care provider. If using cream or ointment, wash the area with soap and water 2 times a day to remove all the cream or ointment. Rinse off the soap and pat the area dry with a clean towel.  Keep the wound area dry and clean. If the bandage becomes wet or dirty, or if it develops a bad smell, change it as soon as possible.  Continue to protect the wound from injury.  Use sunscreen when out in the sun. New scars become sunburned easily. SEEK MEDICAL CARE IF:  You have increasing redness, swelling, or pain in the wound.  You see pus coming from the wound.  You have a fever.  You notice a bad smell coming from the wound or dressing.  Your wound breaks open (edges not staying together).   This information is not intended to replace advice given to you by your health care provider. Make sure you discuss any  questions you have with your health care provider.   Document Released: 12/05/2000 Document Revised: 12/31/2012 Document Reviewed: 10/22/2012 Elsevier Interactive Patient Education 2016 [employer].    IF you received an x-ray today, you will receive an invoice from [facility]. Please contact [facility] at [phone] with questions or concerns regarding your invoice.   IF you received labwork today, you will receive an invoice from [employer]. Please contact [facility] at [phone] with questions or concerns regarding your invoice.   Our billing staff will not be able to assist you with questions regarding bills from these companies.  You will be contacted with the lab results as soon as they are available. The fastest way to get your results is to activate your My Chart account. Instructions are located on the last page of this paperwork. If you have not heard from [place] regarding the results in 2 weeks, please contact this office.

## 2016-06-12 ENCOUNTER — Encounter (HOSPITAL_COMMUNITY): Payer: Self-pay

## 2016-06-12 ENCOUNTER — Emergency Department (HOSPITAL_COMMUNITY): Payer: BLUE CROSS/BLUE SHIELD

## 2016-06-12 ENCOUNTER — Emergency Department (HOSPITAL_COMMUNITY)
Admission: EM | Admit: 2016-06-12 | Discharge: 2016-06-12 | Disposition: A | Discharge status: Home / Self Care | Payer: BLUE CROSS/BLUE SHIELD | Attending: Emergency Medicine | Admitting: Emergency Medicine

## 2016-06-12 DIAGNOSIS — M25461 Effusion, right knee: Secondary | ICD-10-CM

## 2016-06-12 DIAGNOSIS — W0110XA Fall on same level from slipping, tripping and stumbling with subsequent striking against unspecified object, initial encounter: Secondary | ICD-10-CM | POA: Insufficient documentation

## 2016-06-12 DIAGNOSIS — Y999 Unspecified external cause status: Secondary | ICD-10-CM | POA: Diagnosis not present

## 2016-06-12 DIAGNOSIS — S8001XA Contusion of right knee, initial encounter: Secondary | ICD-10-CM | POA: Diagnosis not present

## 2016-06-12 DIAGNOSIS — Y9302 Activity, running: Secondary | ICD-10-CM | POA: Insufficient documentation

## 2016-06-12 DIAGNOSIS — I10 Essential (primary) hypertension: Secondary | ICD-10-CM | POA: Insufficient documentation

## 2016-06-12 DIAGNOSIS — Y929 Unspecified place or not applicable: Secondary | ICD-10-CM | POA: Insufficient documentation

## 2016-06-12 DIAGNOSIS — S8991XA Unspecified injury of right lower leg, initial encounter: Secondary | ICD-10-CM | POA: Diagnosis present

## 2016-06-12 HISTORY — DX: Essential (primary) hypertension: I10

## 2016-06-12 MED ORDER — NAPROXEN 375 MG PO TABS: 375.0000 mg | ORAL_TABLET | Freq: Two times a day (BID) | ORAL | 0 refills | Status: DC

## 2016-06-12 MED ORDER — NAPROXEN 500 MG PO TABS
500.0000 mg | ORAL_TABLET | Freq: Once | ORAL | Status: AC
  Administered 2016-06-12: 500 mg via ORAL
  Filled 2016-06-12: qty 1

## 2016-06-12 NOTE — ED Triage Notes (Signed)
Patient states she fell 8 days ago and continues to have pain in her right knee. Pain is worse with weight bearing.

## 2016-06-12 NOTE — ED Notes (Signed)
Bed: WA20 Expected date:  Expected time:  Means of arrival:  Comments: 

## 2016-06-12 NOTE — ED Provider Notes (Signed)
[facility] DEPT Provider Note   CSN: [id number] Arrival date & time: 06/12/16  0715     History   Chief Complaint Chief Complaint  Patient presents with  . Knee Pain    HPI Shelby Howard is a 46 y.o. female.  Patient is a 46 year old female with a history of hypertension, anemia, bipolar disease and depression presenting today with persistent right knee pain. She states 8 days ago she was running to catch her dog when she tripped and fell. She landed directly on her right knee on a gravel driveway and then fell forward hitting her head. She states her head has healed without any issues but her knee continues to hurt. She has severe pain when trying to walk with swelling and bruising to the right knee. She has not tried taking anything for the pain and has not been elevating or icing it. Denies any numbness or weakness below the injury.   The history is provided by the patient.  Knee Pain   This is a new problem. Episode onset: 8 days ago. The pain is at a severity of 7/10. The pain is moderate. Associated symptoms include limited range of motion and stiffness. Associated symptoms comments: Throbbing pain.    Past Medical History:  Diagnosis Date  . Anemia    history  . Anxiety   . Bipolar disorder ([place])   . Depression   . 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 Fibroids   . Hypertension   . Insomnia     Patient Active Problem List   Diagnosis Date Noted  . Alcohol use disorder, mild, abuse 11/12/2015  . Bipolar I disorder, most recent episode depressed ([place]) 11/12/2015  . Fibroids   . Bipolar disorder ([place])   . Anxiety   . Depression   . Insomnia   . 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 CERVICAL CONIZATION W/BX  03/02/2011   Procedure: CONIZATION CERVIX WITH BIOPSY;  Surgeon: Betsy Coder, MD;  Location: [facility];  Service: Gynecology;  Laterality: N/A;  Cold  Knife Conization, Endocervical Curretage  . CYSTOSCOPY  09/06/2011   Procedure: CYSTOSCOPY;  Surgeon: Betsy Coder, MD;  Location: [facility];  Service: Gynecology;  Laterality: N/A;  . DILATION AND CURETTAGE OF UTERUS  09/2007   resection of polyp  . LAPAROSCOPIC HYSTERECTOMY  09/06/2011   Procedure: HYSTERECTOMY TOTAL LAPAROSCOPIC;  Surgeon: Betsy Coder, MD;  Location: [facility];  Service: Gynecology;  Laterality: N/A;  . SVD  1998   x 1    OB History    Gravida Para Term Preterm AB Living   1 1       1    SAB TAB Ectopic Multiple Live Births                   Home Medications    Prior to Admission medications   Medication Sig Start Date End Date Taking? Authorizing Provider  ALPRAZolam Duanne Moron) 1 MG tablet Take 1 mg by mouth 2 (two) times daily as needed for anxiety. 05/30/16  Yes Historical Provider, MD  lisinopril (PRINIVIL,ZESTRIL) 5 MG tablet Take 1 tablet (5 mg total) by mouth daily. For high blood pressure 11/15/15  Yes Encarnacion Slates, NP  lurasidone (LATUDA) 40 MG TABS tablet Take 1 tablet (40 mg total) by mouth daily with breakfast. For mood control Patient taking differently: Take 40 mg by mouth at  bedtime. For mood control 11/15/15  Yes Encarnacion Slates, NP  zaleplon (SONATA) 5 MG capsule Take 5 mg by mouth at bedtime as needed for sleep. If needed after taking ambien 10/21/15  Yes Historical Provider, MD  zolpidem (AMBIEN) 10 MG tablet Take 1 tablet (10 mg total) by mouth at bedtime as needed for sleep. 11/15/15  Yes Encarnacion Slates, NP  citalopram (CELEXA) 20 MG tablet Take 1 tablet (20 mg total) by mouth daily. For depression Patient taking differently: Take 20 mg by mouth at bedtime. For depression 11/15/15   Encarnacion Slates, NP  gabapentin (NEURONTIN) 100 MG capsule Take 1 capsule (100 mg total) by mouth 3 (three) times daily. For agitation Patient not taking: Reported on 06/12/2016 11/15/15   Encarnacion Slates, NP  ibuprofen (ADVIL,MOTRIN) 600 MG tablet Take 1 tablet (600 mg total) by  mouth every 6 (six) hours as needed. For headache/Cramps Patient not taking: Reported on 06/12/2016 11/15/15   Encarnacion Slates, NP    Family History Family History  Problem Relation Age of Onset  . Hypertension Mother   . Bipolar disorder Mother   . Bipolar disorder Sister     Social History Social History  Substance Use Topics  . Smoking status: Never Smoker  . Smokeless tobacco: Never Used  . Alcohol use 2.4 oz/week    4 Glasses of wine per week     Comment: weekly -wine     Allergies   Patient has no known allergies.   Review of Systems Review of Systems  Musculoskeletal: Positive for stiffness.  All other systems reviewed and are negative.    Physical Exam Updated Vital Signs BP (!) 159/120 (BP Location: Left Arm) Comment: Patient has not taken BP meds today.  Pulse (!) 126   Temp 97.4 F (36.3 C) (Oral)   Resp 18   Ht 5\' 7"  (1.702 m)   Wt 265 lb (120.2 kg)   LMP 08/07/2011   SpO2 98%   BMI 41.50 kg/m   Physical Exam  Constitutional: She is oriented to person, place, and time. She appears well-developed and well-nourished. No distress.  HENT:  Head: Normocephalic and atraumatic.  Cardiovascular: Tachycardia present.   Pulmonary/Chest: Effort normal.  Musculoskeletal:       Right knee: She exhibits decreased range of motion, swelling, effusion and ecchymosis. Tenderness found. Medial joint line and lateral joint line tenderness noted.  No right posterior knee tenderness.  Only minimal pain with axial loading at the foot.  2+ dp pulse and normal sensation in toes.  Warmth over the joint with effusion and swelling without erythema.  Significant healing ecchymosis.  Neurological: She is alert and oriented to person, place, and time. She has normal strength. No sensory deficit.  Nursing note and vitals reviewed.    ED Treatments / Results  Labs (all labs ordered are listed, but only abnormal results are displayed) Labs Reviewed - No data to display  EKG   EKG Interpretation None       Radiology Dg Knee Complete 4 Views Right  Result Date: 06/12/2016 CLINICAL DATA:  Pain following fall approximately 1 week prior EXAM: RIGHT KNEE - COMPLETE 4+ VIEW COMPARISON:  None. FINDINGS: Frontal, lateral, and bilateral oblique views were obtained. No fracture or dislocation. No joint effusion. Joint spaces appear normal. No erosive change. IMPRESSION: No fracture or joint effusion.  No evident arthropathy. Electronically Signed   By: Lowella Grip III M.D.   On: 06/12/2016 08:07  Procedures Procedures (including critical care time)  Medications Ordered in ED Medications  naproxen (NAPROSYN) tablet 500 mg (not administered)     Initial Impression / Assessment and Plan / ED Course  I have reviewed the triage vital signs and the nursing notes.  Pertinent labs & imaging results that were available during my care of the patient were reviewed by me and considered in my medical decision making (see chart for details).     Patient with a knee injury after a fall 8 days ago. Patient is still having significant swelling, effusion, warmth and ecchymosis. No discernible ligamental laxity however still significant swelling. Patient has not tried any supportive care at this time. She was placed in a knee sleeve, given ice, naproxen, crutches. Also given follow-up with orthopedics.  Patient feels better with knee sleeve on his able to stand. It fits appropriately. Patient also noted to be hypertensive here. When speaking with the patient she has not taken her blood pressure medication and 1-2 weeks because she has forgotten. Pertinent to start taking her blood pressure medication again.  Final Clinical Impressions(s) / ED Diagnoses   Final diagnoses:  Effusion of right knee  Contusion of right knee, initial encounter    New Prescriptions New Prescriptions   NAPROXEN (NAPROSYN) 375 MG TABLET    Take 1 tablet (375 mg total) by mouth 2 (two) times  daily.     Blanchie Dessert, MD 06/12/16 1011

## 2016-06-12 NOTE — ED Notes (Signed)
Verbalized understanding discharge instructions, prescription, and follow-up. In no acute distress. Work note provided.

## 2017-01-20 ENCOUNTER — Inpatient Hospital Stay (HOSPITAL_COMMUNITY)
Admission: EM | Admit: 2017-01-20 | Discharge: 2017-01-23 | DRG: 392 | Disposition: A | Discharge status: Home / Self Care | Payer: BLUE CROSS/BLUE SHIELD | Attending: Internal Medicine | Admitting: Internal Medicine

## 2017-01-20 ENCOUNTER — Emergency Department (HOSPITAL_COMMUNITY): Payer: BLUE CROSS/BLUE SHIELD

## 2017-01-20 ENCOUNTER — Encounter (HOSPITAL_COMMUNITY): Payer: Self-pay | Admitting: Emergency Medicine

## 2017-01-20 DIAGNOSIS — G4700 Insomnia, unspecified: Secondary | ICD-10-CM | POA: Diagnosis present

## 2017-01-20 DIAGNOSIS — A084 Viral intestinal infection, unspecified: Secondary | ICD-10-CM | POA: Diagnosis not present

## 2017-01-20 DIAGNOSIS — Z8541 Personal history of malignant neoplasm of cervix uteri: Secondary | ICD-10-CM

## 2017-01-20 DIAGNOSIS — K219 Gastro-esophageal reflux disease without esophagitis: Secondary | ICD-10-CM

## 2017-01-20 DIAGNOSIS — I10 Essential (primary) hypertension: Secondary | ICD-10-CM | POA: Diagnosis present

## 2017-01-20 DIAGNOSIS — F419 Anxiety disorder, unspecified: Secondary | ICD-10-CM | POA: Diagnosis present

## 2017-01-20 DIAGNOSIS — Z79899 Other long term (current) drug therapy: Secondary | ICD-10-CM

## 2017-01-20 DIAGNOSIS — R112 Nausea with vomiting, unspecified: Secondary | ICD-10-CM | POA: Diagnosis present

## 2017-01-20 DIAGNOSIS — E876 Hypokalemia: Secondary | ICD-10-CM | POA: Diagnosis not present

## 2017-01-20 DIAGNOSIS — Z6841 Body Mass Index (BMI) 40.0 and over, adult: Secondary | ICD-10-CM

## 2017-01-20 DIAGNOSIS — F319 Bipolar disorder, unspecified: Secondary | ICD-10-CM | POA: Diagnosis present

## 2017-01-20 DIAGNOSIS — K210 Gastro-esophageal reflux disease with esophagitis: Secondary | ICD-10-CM | POA: Diagnosis present

## 2017-01-20 LAB — BLOOD GAS, VENOUS
Acid-Base Excess: 1.4 mmol/L (ref 0.0–2.0)
Bicarbonate: 22 mmol/L (ref 20.0–28.0)
O2 Saturation: 86.1 %
PATIENT TEMPERATURE: 98.6
PH VEN: 7.568 — AB (ref 7.250–7.430)
PO2 VEN: 49.2 mmHg — AB (ref 32.0–45.0)
pCO2, Ven: 24 mmHg — ABNORMAL LOW (ref 44.0–60.0)

## 2017-01-20 LAB — LIPASE, BLOOD: LIPASE: 22 U/L (ref 11–51)

## 2017-01-20 LAB — CBC
HEMATOCRIT: 37.4 % (ref 36.0–46.0)
Hemoglobin: 12.7 g/dL (ref 12.0–15.0)
MCH: 27.9 pg (ref 26.0–34.0)
MCHC: 34 g/dL (ref 30.0–36.0)
MCV: 82.2 fL (ref 78.0–100.0)
Platelets: 308 10*3/uL (ref 150–400)
RBC: 4.55 MIL/uL (ref 3.87–5.11)
RDW: 14.7 % (ref 11.5–15.5)
WBC: 20.4 10*3/uL — AB (ref 4.0–10.5)

## 2017-01-20 LAB — COMPREHENSIVE METABOLIC PANEL
ALBUMIN: 4.2 g/dL (ref 3.5–5.0)
ALT: 26 U/L (ref 14–54)
AST: 75 U/L — AB (ref 15–41)
Alkaline Phosphatase: 86 U/L (ref 38–126)
Anion gap: 21 — ABNORMAL HIGH (ref 5–15)
BUN: 15 mg/dL (ref 6–20)
CHLORIDE: 97 mmol/L — AB (ref 101–111)
CO2: 20 mmol/L — AB (ref 22–32)
CREATININE: 0.94 mg/dL (ref 0.44–1.00)
Calcium: 9 mg/dL (ref 8.9–10.3)
GFR calc Af Amer: >60 mL/min (ref >60)
GFR calc non Af Amer: >60 mL/min (ref >60)
GLUCOSE: 144 mg/dL — AB (ref 65–99)
POTASSIUM: 3.2 mmol/L — AB (ref 3.5–5.1)
SODIUM: 138 mmol/L (ref 135–145)
Total Bilirubin: 1.3 mg/dL — ABNORMAL HIGH (ref 0.3–1.2)
Total Protein: 8.6 g/dL — ABNORMAL HIGH (ref 6.5–8.1)

## 2017-01-20 LAB — WET PREP, GENITAL
CLUE CELLS WET PREP: NONE SEEN
Sperm: NONE SEEN
Trich, Wet Prep: NONE SEEN
WBC WET PREP: NONE SEEN
YEAST WET PREP: NONE SEEN

## 2017-01-20 LAB — URINALYSIS, ROUTINE W REFLEX MICROSCOPIC
BILIRUBIN URINE: NEGATIVE
Glucose, UA: NEGATIVE mg/dL
Ketones, ur: 5 mg/dL — AB
LEUKOCYTES UA: NEGATIVE
Nitrite: NEGATIVE
Protein, ur: >=300 mg/dL — AB
SPECIFIC GRAVITY, URINE: 1.03 (ref 1.005–1.030)
pH: 6 (ref 5.0–8.0)

## 2017-01-20 LAB — RAPID URINE DRUG SCREEN, HOSP PERFORMED
Amphetamines: NOT DETECTED
BARBITURATES: NOT DETECTED
BENZODIAZEPINES: POSITIVE — AB
COCAINE: NOT DETECTED
Opiates: NOT DETECTED
TETRAHYDROCANNABINOL: POSITIVE — AB

## 2017-01-20 LAB — ETHANOL

## 2017-01-20 LAB — I-STAT CG4 LACTIC ACID, ED: LACTIC ACID, VENOUS: 1.65 mmol/L (ref 0.5–1.9)

## 2017-01-20 MED ORDER — IOPAMIDOL (ISOVUE-300) INJECTION 61%
100.0000 mL | Freq: Once | INTRAVENOUS | Status: AC | PRN
  Administered 2017-01-20: 100 mL via INTRAVENOUS

## 2017-01-20 MED ORDER — ACETAMINOPHEN 325 MG PO TABS
650.0000 mg | ORAL_TABLET | Freq: Four times a day (QID) | ORAL | Status: DC | PRN
  Administered 2017-01-21 – 2017-01-23 (×2): 650 mg via ORAL
  Filled 2017-01-20 (×3): qty 2

## 2017-01-20 MED ORDER — ONDANSETRON 4 MG PO TBDP
4.0000 mg | ORAL_TABLET | Freq: Once | ORAL | Status: AC | PRN
  Administered 2017-01-20: 4 mg via ORAL
  Filled 2017-01-20: qty 1

## 2017-01-20 MED ORDER — ONDANSETRON HCL 4 MG/2ML IJ SOLN
4.0000 mg | Freq: Four times a day (QID) | INTRAMUSCULAR | Status: DC | PRN
  Administered 2017-01-20 – 2017-01-22 (×6): 4 mg via INTRAVENOUS
  Filled 2017-01-20 (×6): qty 2

## 2017-01-20 MED ORDER — ONDANSETRON 8 MG PO TBDP: 8.0000 mg | ORAL_TABLET | Freq: Three times a day (TID) | ORAL | 0 refills | Status: DC | PRN

## 2017-01-20 MED ORDER — FAMOTIDINE 20 MG PO TABS
20.0000 mg | ORAL_TABLET | Freq: Two times a day (BID) | ORAL | Status: DC
  Administered 2017-01-20 – 2017-01-21 (×2): 20 mg via ORAL
  Filled 2017-01-20 (×2): qty 1

## 2017-01-20 MED ORDER — POTASSIUM CHLORIDE CRYS ER 20 MEQ PO TBCR
40.0000 meq | EXTENDED_RELEASE_TABLET | Freq: Once | ORAL | Status: AC
  Administered 2017-01-20: 40 meq via ORAL
  Filled 2017-01-20: qty 2

## 2017-01-20 MED ORDER — PROMETHAZINE HCL 25 MG/ML IJ SOLN
12.5000 mg | Freq: Once | INTRAMUSCULAR | Status: AC
  Administered 2017-01-20: 12.5 mg via INTRAVENOUS
  Filled 2017-01-20 (×2): qty 1

## 2017-01-20 MED ORDER — ACETAMINOPHEN 650 MG RE SUPP
650.0000 mg | Freq: Four times a day (QID) | RECTAL | Status: DC | PRN
Start: 2017-01-20 — End: 2017-01-23

## 2017-01-20 MED ORDER — SODIUM CHLORIDE 0.9 % IV SOLN
INTRAVENOUS | Status: DC
  Administered 2017-01-20 – 2017-01-23 (×6): via INTRAVENOUS

## 2017-01-20 MED ORDER — ONDANSETRON HCL 4 MG PO TABS: 4.0000 mg | ORAL_TABLET | Freq: Four times a day (QID) | ORAL | Status: DC | PRN

## 2017-01-20 MED ORDER — ONDANSETRON HCL 4 MG/2ML IJ SOLN
4.0000 mg | Freq: Once | INTRAMUSCULAR | Status: AC
  Administered 2017-01-20: 4 mg via INTRAVENOUS
  Filled 2017-01-20: qty 2

## 2017-01-20 MED ORDER — HYDROMORPHONE HCL 1 MG/ML IJ SOLN
0.5000 mg | Freq: Once | INTRAMUSCULAR | Status: AC
  Administered 2017-01-20: 0.5 mg via INTRAVENOUS
  Filled 2017-01-20: qty 1

## 2017-01-20 MED ORDER — MORPHINE SULFATE (PF) 4 MG/ML IV SOLN
4.0000 mg | Freq: Once | INTRAVENOUS | Status: AC
  Administered 2017-01-20: 4 mg via INTRAVENOUS
  Filled 2017-01-20: qty 1

## 2017-01-20 MED ORDER — SODIUM CHLORIDE 0.9 % IV BOLUS (SEPSIS)
1000.0000 mL | Freq: Once | INTRAVENOUS | Status: AC
  Administered 2017-01-20: 1000 mL via INTRAVENOUS

## 2017-01-20 MED ORDER — HYDROMORPHONE HCL 1 MG/ML IJ SOLN
1.0000 mg | Freq: Once | INTRAMUSCULAR | Status: AC
  Administered 2017-01-20: 1 mg via INTRAVENOUS
  Filled 2017-01-20: qty 1

## 2017-01-20 MED ORDER — IOPAMIDOL (ISOVUE-300) INJECTION 61%
INTRAVENOUS | Status: AC
  Filled 2017-01-20: qty 100

## 2017-01-20 MED ORDER — PROMETHAZINE HCL 25 MG RE SUPP: 25.0000 mg | Freq: Four times a day (QID) | RECTAL | 0 refills | Status: DC | PRN

## 2017-01-20 MED ORDER — KETOROLAC TROMETHAMINE 30 MG/ML IJ SOLN
30.0000 mg | Freq: Once | INTRAMUSCULAR | Status: AC
  Administered 2017-01-20: 30 mg via INTRAVENOUS
  Filled 2017-01-20: qty 1

## 2017-01-20 NOTE — ED Notes (Signed)
Notified PA Shawn that pt HR was 150's when she was standing to get dressed. Pt is asymptomatic with HR and sts she feels fine

## 2017-01-20 NOTE — ED Notes (Signed)
Pt mother came to nurses station and reports that pt is in pain again and she is vomiting. Notified [place], [place]

## 2017-01-20 NOTE — ED Triage Notes (Signed)
Patient complaining of vomiting since Friday. Patient states that she was vegan for 5-6 months. Patient ate some chicken on Friday and has been vomiting since. Patient states she can not even keep water down.

## 2017-01-20 NOTE — ED Notes (Signed)
Pt tolerated PO K+ and ginger ale w/o difficulty

## 2017-01-20 NOTE — ED Provider Notes (Signed)
Shelby Howard is a 46 y.o. female, with a history of anemia, alcohol abuse, depression, HTN, presenting to the ED with N/V for the last three days. Complains of abdominal pain during my interview. Pain is constant, 4/10, describes it as an "annoying pain that feels like nausea," nonradiating.  States she went into a detox facility from alcohol in May 2018. States she has not had any alcohol since then. Last BM Oct 26. Denies illicit drug use. Denies diarrhea, hematochezia/melena, fever/chills, abnormal vaginal discharge, vaginal bleeding, urinary complaints, or any other complaints.   HPI from Lorre Munroe, PA-C: "Patient presents to the emergency department with chief complaint of vomiting.  She reports that she has been vomiting since Friday.  She reports seeing some blood-tinged vomit.  She denies any fevers, chills, diarrhea.  She denies any abdominal pain, except for immediately preceding the vomiting.  She denies having taken anything for symptoms.  She states that she is a vegan, but ate meat this week; she thinks that this caused the vomiting."  Past Medical History:  Diagnosis Date  . Anemia    history  . Anxiety   . Bipolar disorder ([place])   . Depression   . 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 Fibroids   . Hypertension   . Insomnia    Past Surgical History:  Procedure Laterality Date  . CERVICAL CONIZATION W/BX  03/02/2011   Procedure: CONIZATION CERVIX WITH BIOPSY;  Surgeon: Betsy Coder, MD;  Location: [facility];  Service: Gynecology;  Laterality: N/A;  Cold Knife Conization, Endocervical Curretage  . CYSTOSCOPY  09/06/2011   Procedure: CYSTOSCOPY;  Surgeon: Betsy Coder, MD;  Location: [facility];  Service: Gynecology;  Laterality: N/A;  . DILATION AND CURETTAGE OF UTERUS  09/2007   resection of polyp  . LAPAROSCOPIC HYSTERECTOMY  09/06/2011   Procedure: HYSTERECTOMY TOTAL LAPAROSCOPIC;  Surgeon: Betsy Coder, MD;  Location: [facility];  Service: Gynecology;  Laterality: N/A;  .  SVD  1998   x 1    Physical Exam  BP (!) 163/93 (BP Location: Left Arm)   Pulse 83   Temp 98.2 F (36.8 C) (Oral)   Resp 17   Ht 5' 6.5" (1.689 m)   Wt 124.3 kg (274 lb)   LMP 08/07/2011   SpO2 100%   BMI 43.56 kg/m   Physical Exam  Constitutional: She appears well-developed and well-nourished. No distress.  HENT:  Head: Normocephalic and atraumatic.  Eyes: Conjunctivae are normal.  Neck: Neck supple.  Cardiovascular: Normal rate, regular rhythm, normal heart sounds and intact distal pulses.   Pulmonary/Chest: Effort normal and breath sounds normal. No respiratory distress.  Abdominal: Soft. There is tenderness in the epigastric area, periumbilical area and left lower quadrant. There is no guarding.  Genitourinary:  Genitourinary Comments: External genitalia normal Vagina without discharge Cervix  normal negative for cervical motion tenderness Adnexa palpated, no masses, negative for tenderness noted Bladder palpated negative for tenderness Uterus palpated no masses, negative for tenderness  No inguinal lymphadenopathy. Otherwise normal female genitalia. Med Tech, Almyra Free, served as chaperone during exam.  Musculoskeletal: She exhibits no edema.  Lymphadenopathy:    She has no cervical adenopathy.  Neurological: She is alert.  Skin: Skin is warm and dry. She is not diaphoretic.  Psychiatric: She has a normal mood and affect. Her behavior is normal.  Nursing note and vitals reviewed.    ED Course  Pelvic exam Date/Time: 01/20/2017 10:40 AM Performed by: Lorayne Bender Authorized by: Caryl Asp  Ngan Qualls C  Consent: Verbal consent obtained. Risks and benefits: risks, benefits and alternatives were discussed Consent given by: patient Patient identity confirmed: verbally with patient and provided demographic data Local anesthesia used: no  Anesthesia: Local anesthesia used: no  Sedation: Patient sedated: no Patient tolerance: Patient tolerated the procedure well with no  immediate complications      Results for orders placed or performed during the hospital encounter of 01/20/17  Wet prep, genital  Result Value Ref Range   Yeast Wet Prep HPF POC NONE SEEN NONE SEEN   Trich, Wet Prep NONE SEEN NONE SEEN   Clue Cells Wet Prep HPF POC NONE SEEN NONE SEEN   WBC, Wet Prep HPF POC NONE SEEN NONE SEEN   Sperm NONE SEEN   Lipase, blood  Result Value Ref Range   Lipase 22 11 - 51 U/L  Comprehensive metabolic panel  Result Value Ref Range   Sodium 138 135 - 145 mmol/L   Potassium 3.2 (L) 3.5 - 5.1 mmol/L   Chloride 97 (L) 101 - 111 mmol/L   CO2 20 (L) 22 - 32 mmol/L   Glucose, Bld 144 (H) 65 - 99 mg/dL   BUN 15 6 - 20 mg/dL   Creatinine, Ser 0.94 0.44 - 1.00 mg/dL   Calcium 9.0 8.9 - 10.3 mg/dL   Total Protein 8.6 (H) 6.5 - 8.1 g/dL   Albumin 4.2 3.5 - 5.0 g/dL   AST 75 (H) 15 - 41 U/L   ALT 26 14 - 54 U/L   Alkaline Phosphatase 86 38 - 126 U/L   Total Bilirubin 1.3 (H) 0.3 - 1.2 mg/dL   GFR calc non Af Amer >60 >60 mL/min   GFR calc Af Amer >60 >60 mL/min   Anion gap 21 (H) 5 - 15  CBC  Result Value Ref Range   WBC 20.4 (H) 4.0 - 10.5 K/uL   RBC 4.55 3.87 - 5.11 MIL/uL   Hemoglobin 12.7 12.0 - 15.0 g/dL   HCT 37.4 36.0 - 46.0 %   MCV 82.2 78.0 - 100.0 fL   MCH 27.9 26.0 - 34.0 pg   MCHC 34.0 30.0 - 36.0 g/dL   RDW 14.7 11.5 - 15.5 %   Platelets 308 150 - 400 K/uL  Urinalysis, Routine w reflex microscopic  Result Value Ref Range   Color, Urine AMBER (A) YELLOW   APPearance HAZY (A) CLEAR   Specific Gravity, Urine 1.030 1.005 - 1.030   pH 6.0 5.0 - 8.0   Glucose, UA NEGATIVE NEGATIVE mg/dL   Hgb urine dipstick MODERATE (A) NEGATIVE   Bilirubin Urine NEGATIVE NEGATIVE   Ketones, ur 5 (A) NEGATIVE mg/dL   Protein, ur >=300 (A) NEGATIVE mg/dL   Nitrite NEGATIVE NEGATIVE   Leukocytes, UA NEGATIVE NEGATIVE   RBC / HPF 6-30 0 - 5 RBC/hpf   WBC, UA 0-5 0 - 5 WBC/hpf   Bacteria, UA RARE (A) NONE SEEN   Squamous Epithelial / LPF 0-5 (A) NONE  SEEN   Mucus PRESENT   Ethanol  Result Value Ref Range   Alcohol, Ethyl (B) <10 <10 mg/dL  Blood gas, venous  Result Value Ref Range   pH, Ven 7.568 (H) 7.250 - 7.430   pCO2, Ven 24.0 (L) 44.0 - 60.0 mmHg   pO2, Ven 49.2 (H) 32.0 - 45.0 mmHg   Bicarbonate 22.0 20.0 - 28.0 mmol/L   Acid-Base Excess 1.4 0.0 - 2.0 mmol/L   O2 Saturation 86.1 %   Patient temperature  98.6   I-Stat CG4 Lactic Acid, ED  Result Value Ref Range   Lactic Acid, Venous 1.65 0.5 - 1.9 mmol/L   US Transvaginal Non-ob  Result Date: 01/20/2017 CLINICAL DATA:  Vomiting for 3 days. Left lower quadrant pain on palpation. Previous hysterectomy 2013 for cervical cancer. Gravida 1 para 1. Negative CT today. LMP 2013. EXAM: TRANSABDOMINAL AND TRANSVAGINAL ULTRASOUND OF PELVIS DOPPLER ULTRASOUND OF OVARIES TECHNIQUE: Both transabdominal and transvaginal ultrasound examinations of the pelvis were performed. Transabdominal technique was performed for global imaging of the pelvis including uterus, ovaries, adnexal regions, and pelvic cul-de-sac. It was necessary to proceed with endovaginal exam following the transabdominal exam to visualize the ovaries and adnexal regions. Color and duplex Doppler ultrasound was utilized to evaluate blood flow to the ovaries. COMPARISON:  CT of the abdomen and pelvis 01/20/2017 FINDINGS: Uterus Measurements: Surgically absent. Vaginal cuff is unremarkable in appearance. Endometrium Thickness: Uterus is surgically absent. Right ovary Measurements: 3.1 x 2.1 x 1.8 cm. Normal appearance/no adnexal mass. Left ovary Measurements: 4.6 x 2.2 x 1.6 cm. Small follicles are present, largest measuring 1.5 cm. Pulsed Doppler evaluation of both ovaries demonstrates normal low-resistance arterial and venous waveforms. Other findings Small amount of fluid in the left adnexal region. IMPRESSION: 1. Status post hysterectomy. 2. Normal appearance of both ovaries. 3. No adnexal mass or vaginal cuff abnormality.  Electronically Signed   By: Nolon Nations M.D.   On: 01/20/2017 09:35   US Pelvis Complete  Result Date: 01/20/2017 CLINICAL DATA:  Vomiting for 3 days. Left lower quadrant pain on palpation. Previous hysterectomy 2013 for cervical cancer. Gravida 1 para 1. Negative CT today. LMP 2013. EXAM: TRANSABDOMINAL AND TRANSVAGINAL ULTRASOUND OF PELVIS DOPPLER ULTRASOUND OF OVARIES TECHNIQUE: Both transabdominal and transvaginal ultrasound examinations of the pelvis were performed. Transabdominal technique was performed for global imaging of the pelvis including uterus, ovaries, adnexal regions, and pelvic cul-de-sac. It was necessary to proceed with endovaginal exam following the transabdominal exam to visualize the ovaries and adnexal regions. Color and duplex Doppler ultrasound was utilized to evaluate blood flow to the ovaries. COMPARISON:  CT of the abdomen and pelvis 01/20/2017 FINDINGS: Uterus Measurements: Surgically absent. Vaginal cuff is unremarkable in appearance. Endometrium Thickness: Uterus is surgically absent. Right ovary Measurements: 3.1 x 2.1 x 1.8 cm. Normal appearance/no adnexal mass. Left ovary Measurements: 4.6 x 2.2 x 1.6 cm. Small follicles are present, largest measuring 1.5 cm. Pulsed Doppler evaluation of both ovaries demonstrates normal low-resistance arterial and venous waveforms. Other findings Small amount of fluid in the left adnexal region. IMPRESSION: 1. Status post hysterectomy. 2. Normal appearance of both ovaries. 3. No adnexal mass or vaginal cuff abnormality. Electronically Signed   By: Nolon Nations M.D.   On: 01/20/2017 09:35   Ct Abdomen Pelvis W Contrast  Result Date: 01/20/2017 CLINICAL DATA:  Acute onset of vomiting after eating chicken. EXAM: CT ABDOMEN AND PELVIS WITH CONTRAST TECHNIQUE: Multidetector CT imaging of the abdomen and pelvis was performed using the standard protocol following bolus administration of intravenous contrast. CONTRAST:  159mL ISOVUE-300  IOPAMIDOL (ISOVUE-300) INJECTION 61% COMPARISON:  CT of the abdomen and pelvis from 03/18/2007, and abdominal ultrasound performed 03/17/2007 FINDINGS: Lower chest: Wall thickening at the distal esophagus raises concern for esophagitis. The visualized portions of the mediastinum are unremarkable. The visualized lung bases are clear. Hepatobiliary: The liver is unremarkable in appearance. The gallbladder is unremarkable in appearance. The common bile duct remains normal in caliber. Pancreas: The pancreas is within normal limits.  Spleen: The spleen is unremarkable in appearance. Adrenals/Urinary Tract: The adrenal glands are unremarkable in appearance. The kidneys are within normal limits. There is no evidence of hydronephrosis. No renal or ureteral stones are identified. No perinephric stranding is seen. Set Stomach/Bowel: The stomach is unremarkable in appearance. The small bowel is within normal limits. The appendix is normal in caliber, without evidence of appendicitis. The colon is unremarkable in appearance. Vascular/Lymphatic: The abdominal aorta is unremarkable in appearance. The inferior vena cava is grossly unremarkable. No retroperitoneal lymphadenopathy is seen. No pelvic sidewall lymphadenopathy is identified. Reproductive: The bladder is mildly distended and grossly unremarkable. The patient is status post hysterectomy. The ovaries are relatively symmetric. No suspicious adnexal masses are seen. Other: No additional soft tissue abnormalities are seen. Musculoskeletal: No acute osseous abnormalities are identified. The visualized musculature is unremarkable in appearance. IMPRESSION: 1. Wall thickening along the distal esophagus raises concern for esophagitis. This is similar to its appearance in 2008, and may be chronic in nature, or may reflect a recurrent process. Would correlate with the patient's symptoms. 2. Otherwise unremarkable contrast-enhanced CT of the abdomen and pelvis. Electronically Signed    By: Garald Balding M.D.   On: 01/20/2017 07:01   [place] Art/ven Flow Abd Pelv Doppler  Result Date: 01/20/2017 CLINICAL DATA:  Vomiting for 3 days. Left lower quadrant pain on palpation. Previous hysterectomy 2013 for cervical cancer. Gravida 1 para 1. Negative CT today. LMP 2013. EXAM: TRANSABDOMINAL AND TRANSVAGINAL ULTRASOUND OF PELVIS DOPPLER ULTRASOUND OF OVARIES TECHNIQUE: Both transabdominal and transvaginal ultrasound examinations of the pelvis were performed. Transabdominal technique was performed for global imaging of the pelvis including uterus, ovaries, adnexal regions, and pelvic cul-de-sac. It was necessary to proceed with endovaginal exam following the transabdominal exam to visualize the ovaries and adnexal regions. Color and duplex Doppler ultrasound was utilized to evaluate blood flow to the ovaries. COMPARISON:  CT of the abdomen and pelvis 01/20/2017 FINDINGS: Uterus Measurements: Surgically absent. Vaginal cuff is unremarkable in appearance. Endometrium Thickness: Uterus is surgically absent. Right ovary Measurements: 3.1 x 2.1 x 1.8 cm. Normal appearance/no adnexal mass. Left ovary Measurements: 4.6 x 2.2 x 1.6 cm. Small follicles are present, largest measuring 1.5 cm. Pulsed Doppler evaluation of both ovaries demonstrates normal low-resistance arterial and venous waveforms. Other findings Small amount of fluid in the left adnexal region. IMPRESSION: 1. Status post hysterectomy. 2. Normal appearance of both ovaries. 3. No adnexal mass or vaginal cuff abnormality. Electronically Signed   By: Nolon Nations M.D.   On: 01/20/2017 09:35    Medications  iopamidol (ISOVUE-300) 61 % injection (not administered)  ondansetron (ZOFRAN-ODT) disintegrating tablet 4 mg (4 mg Oral Given 01/20/17 0330)  sodium chloride 0.9 % bolus 1,000 mL (0 mLs Intravenous Stopped 01/20/17 0504)  ondansetron (ZOFRAN) injection 4 mg (4 mg Intravenous Given 01/20/17 0721)  morphine 4 MG/ML injection 4 mg (4 mg  Intravenous Given 01/20/17 0720)  sodium chloride 0.9 % bolus 1,000 mL (0 mLs Intravenous Stopped 01/20/17 0937)  iopamidol (ISOVUE-300) 61 % injection 100 mL (100 mLs Intravenous Contrast Given 01/20/17 0629)  potassium chloride SA (K-DUR,KLOR-CON) CR tablet 40 mEq (40 mEq Oral Given 01/20/17 0934)  HYDROmorphone (DILAUDID) injection 1 mg (1 mg Intravenous Given 01/20/17 0801)  sodium chloride 0.9 % bolus 1,000 mL (0 mLs Intravenous Stopped 01/20/17 1412)  promethazine (PHENERGAN) injection 12.5 mg (12.5 mg Intravenous Given 01/20/17 1444)  HYDROmorphone (DILAUDID) injection 0.5 mg (0.5 mg Intravenous Given 01/20/17 1445)    MDM  Clinical  Course as of Jan 20 1445  Nancy Fetter Jan 20, 2017  [place] patient care handoff report from [employer], [place]. Fluids, pain control, antiemetics, CT abdomen  [SJ]  0754 Patient voices persistence in her abdominal pain. No relief with morphine.  [SJ]  5456 Discussed imaging results with the patient.  Patient rates her pain 1/10.  Abdominal exam benign.  [SJ]  14 Was notified by RN that patient's HR elevated to 150 when she stood up.  Spoke with the patient about this incident.  Abdominal pain and vomiting have resolved.  Patient states that she had no dizziness, lightheadedness, headache, chest pain, shortness of breath, palpitations, or any other complaints during this time.  Patient states she would like to be discharged, however, is willing to try another fluid bolus and have her vital signs retested.   [SJ]  2563 Patient reevaluated. Still denies recurrence of abdominal pain and vomiting.  [SJ]  11 RN notified me that patient had sudden recurrence of vomiting and was unable to complete a discharge PO challenge.  [SJ]  [place] with Dr. Aggie Moats, hospitalist, who agrees to admit the patient.   [SJ]    Clinical Course User Index [SJ] Rickie Gange C, PA-C    Patient presents with nausea and vomiting.  Noted to have anion gap of 21 as well as CO2 of 20.  IV  fluids initiated.  Developed abdominal pain during her ED course.  CT scan and ultrasound without acute abnormality to explain patient's lower abdominal pain.  Patient's vomiting improved, but then recurred.  Admission for intractable vomiting.   Findings and plan of care discussed with Lacretia Leigh, MD.   Vitals:   01/20/17 0331  BP: (!) 163/93  Pulse: 83  Resp: 17  Temp: 98.2 F (36.8 C)  TempSrc: Oral  SpO2: 100%  Weight: 124.3 kg (274 lb)  Height: 5' 6.5" (1.689 m)   Vitals:   01/20/17 1054 01/20/17 1057 01/20/17 1100 01/20/17 1110  BP:    (!) 139/107  Pulse: (!) 138 (!) 140 (!) 151 (!) 115  Resp: 18 (!) 21  20  Temp:      TempSrc:      SpO2: 98% 99% 99% 96%  Weight:      Height:       Vitals:   01/20/17 1100 01/20/17 1110 01/20/17 1136 01/20/17 1318  BP:  (!) 139/107  (!) 153/100  Pulse: (!) 151 (!) 115  89  Resp:  20  18  Temp:   99.9 F (37.7 C)   TempSrc:   Rectal   SpO2: 99% 96%  98%  Weight:      Height:         Orthostatic VS for the past 24 hrs:  BP- Lying Pulse- Lying BP- Sitting Pulse- Sitting BP- Standing at 0 minutes Pulse- Standing at 0 minutes  01/20/17 1325 (!) 164/98 83 (!) 187/98 91 (!) 193/112 94  01/20/17 1136 (!) 159/95 99 (!) 173/104 117 (!) 161/116 [address], PA-C 01/20/17 1446    Lacretia Leigh, MD 01/20/17 [phone]

## 2017-01-20 NOTE — ED Notes (Signed)
Lactic acid 1.65- result did not cross over- [place], [place] notified

## 2017-01-20 NOTE — Discharge Instructions (Addendum)
You have been evaluated for nausea and vomiting. You received IV hydration and your vital signs normalized.  Please follow-up with your primary care provider on this matter.  Sure to continue to drink plenty of water. May use the Zofran, as needed, for nausea/vomiting. If this does not work, you may use the phenergan suppository. There was some blood noted in the urine.  Please follow-up with urology on this matter. ED for persistent and worsening abdominal pain, persistent vomiting, fever that does not respond to Tylenol or ibuprofen, or any other major concerns.

## 2017-01-20 NOTE — ED Notes (Signed)
CALL REPORT TO [place] @ 16:29,  [phone]

## 2017-01-20 NOTE — ED Notes (Signed)
Requested urine from patient. 

## 2017-01-20 NOTE — H&P (Signed)
[facility] History and Physical  Emmanuela Ghazi MPN:[id number] DOB: 09-13-1970 DOA: 01/20/2017  Referring physician:  PCP: Crawford Givens, MD   Chief Complaint: Nausea vomiting  HPI: Shelby Howard is a 46 y.o. female with past medical history of hypertension, bipolar and anxiety presents emergency room with continuous nausea vomiting.  Multiple episodes prior to coming to the hospital.  Multiple episodes afterwards.  No diarrhea.  No similar prior episodes.  No sick contacts.  No suspicious ingestions.  No abdominal trauma.  ED course: Patient was given 3 different antiemetics with no symptom relief.  Hospitalists consulted for admission.   Review of Systems:  As per HPI otherwise 10 point review of systems negative.    Past Medical History:  Diagnosis Date  . Anemia    history  . Anxiety   . Bipolar disorder ([place])   . Depression   . 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 Fibroids   . Hypertension   . Insomnia    Past Surgical History:  Procedure Laterality Date  . CERVICAL CONIZATION W/BX  03/02/2011   Procedure: CONIZATION CERVIX WITH BIOPSY;  Surgeon: Betsy Coder, MD;  Location: [facility];  Service: Gynecology;  Laterality: N/A;  Cold Knife Conization, Endocervical Curretage  . CYSTOSCOPY  09/06/2011   Procedure: CYSTOSCOPY;  Surgeon: Betsy Coder, MD;  Location: [facility];  Service: Gynecology;  Laterality: N/A;  . DILATION AND CURETTAGE OF UTERUS  09/2007   resection of polyp  . LAPAROSCOPIC HYSTERECTOMY  09/06/2011   Procedure: HYSTERECTOMY TOTAL LAPAROSCOPIC;  Surgeon: Betsy Coder, MD;  Location: [facility];  Service: Gynecology;  Laterality: N/A;  . SVD  1998   x 1   Social History:  reports that she has never smoked. She has never used smokeless tobacco. She reports that she drinks about 2.4 oz of alcohol per week . She reports that she does not use drugs.  No Known Allergies  Family History  Problem Relation Age of Onset  . Hypertension Mother   .  Bipolar disorder Mother   . Bipolar disorder Sister      Prior to Admission medications   Medication Sig Start Date End Date Taking? Authorizing Provider  ALPRAZolam Duanne Moron) 1 MG tablet Take 1 mg by mouth 2 (two) times daily as needed for anxiety. 05/30/16  Yes [provider]  citalopram (CELEXA) 20 MG tablet Take 1 tablet (20 mg total) by mouth daily. For depression Patient taking differently: Take 20 mg by mouth at bedtime. For depression 11/15/15  Yes Nwoko, Herbert Pun I, NP  lurasidone (LATUDA) 40 MG TABS tablet Take 1 tablet (40 mg total) by mouth daily with breakfast. For mood control Patient taking differently: Take 40 mg by mouth at bedtime. For mood control 11/15/15  Yes Lindell Spar I, NP  zaleplon (SONATA) 5 MG capsule Take 5 mg by mouth at bedtime as needed for sleep. If needed after taking ambien 10/21/15  Yes [provider]  zolpidem (AMBIEN) 10 MG tablet Take 1 tablet (10 mg total) by mouth at bedtime as needed for sleep. 11/15/15  Yes Nwoko, Herbert Pun I, NP  gabapentin (NEURONTIN) 100 MG capsule Take 1 capsule (100 mg total) by mouth 3 (three) times daily. For agitation Patient not taking: Reported on 06/12/2016 11/15/15   Lindell Spar I, NP  lisinopril (PRINIVIL,ZESTRIL) 5 MG tablet Take 1 tablet (5 mg total) by mouth daily. For high blood pressure Patient not taking: Reported on 01/20/2017 11/15/15   Lindell Spar I, NP  ondansetron (  ZOFRAN ODT) 8 MG disintegrating tablet Take 1 tablet (8 mg total) by mouth every 8 (eight) hours as needed for nausea or vomiting. 01/20/17   Joy, Shawn C, PA-C  promethazine (PHENERGAN) 25 MG suppository Place 1 suppository (25 mg total) rectally every 6 (six) hours as needed for nausea or vomiting. 01/20/17   Lorayne Bender, PA-C   Physical Exam: Vitals:   01/20/17 1136 01/20/17 1318 01/20/17 1525 01/20/17 1558  BP:  (!) 153/100 (!) 149/97 (!) 154/94  Pulse:  89 96 95  Resp:  18 18   Temp: 99.9 F (37.7 C)   98.5 F (36.9 C)  TempSrc:  Rectal   Oral  SpO2:  98% 94% 98%  Weight:    123.3 kg (271 lb 12.8 oz)  Height:    5\' 6"  (1.676 m)    Wt Readings from Last 3 Encounters:  01/20/17 123.3 kg (271 lb 12.8 oz)  06/12/16 120.2 kg (265 lb)  11/26/15 116.3 kg (256 lb 6 oz)    General:  Appears calm and comfortable; A&Ox3 Eyes:  PERRL, EOMI, normal lids, iris ENT:  grossly normal hearing, lips & tongue Neck:  no LAD, masses or thyromegaly Cardiovascular:  RRR, no m/r/g. No LE edema.  Respiratory:  CTA bilaterally, no w/r/r. Normal respiratory effort. Abdomen:  soft, ntnd Skin:  no rash or induration seen on limited exam Musculoskeletal:  grossly normal tone BUE/BLE Psychiatric:  grossly normal mood and affect, speech fluent and appropriate Neurologic:  CN 2-12 grossly intact, moves all extremities in coordinated fashion.          Labs on Admission:  Basic Metabolic Panel:  Recent Labs Lab 01/20/17 0333  NA 138  K 3.2*  CL 97*  CO2 20*  GLUCOSE 144*  BUN 15  CREATININE 0.94  CALCIUM 9.0   Liver Function Tests:  Recent Labs Lab 01/20/17 0333  AST 75*  ALT 26  ALKPHOS 86  BILITOT 1.3*  PROT 8.6*  ALBUMIN 4.2    Recent Labs Lab 01/20/17 0333  LIPASE 22   No results for input(s): AMMONIA in the last 168 hours. CBC:  Recent Labs Lab 01/20/17 0333  WBC 20.4*  HGB 12.7  HCT 37.4  MCV 82.2  PLT 308   Cardiac Enzymes: No results for input(s): CKTOTAL, CKMB, CKMBINDEX, TROPONINI in the last 168 hours.  BNP (last 3 results) No results for input(s): BNP in the last 8760 hours.  ProBNP (last 3 results) No results for input(s): PROBNP in the last 8760 hours.   Serum creatinine: 0.94 mg/dL 01/20/17 0333 Estimated creatinine clearance: 102.4 mL/min  CBG: No results for input(s): GLUCAP in the last 168 hours.  Radiological Exams on Admission: US Transvaginal Non-ob  Result Date: 01/20/2017 CLINICAL DATA:  Vomiting for 3 days. Left lower quadrant pain on palpation. Previous  hysterectomy 2013 for cervical cancer. Gravida 1 para 1. Negative CT today. LMP 2013. EXAM: TRANSABDOMINAL AND TRANSVAGINAL ULTRASOUND OF PELVIS DOPPLER ULTRASOUND OF OVARIES TECHNIQUE: Both transabdominal and transvaginal ultrasound examinations of the pelvis were performed. Transabdominal technique was performed for global imaging of the pelvis including uterus, ovaries, adnexal regions, and pelvic cul-de-sac. It was necessary to proceed with endovaginal exam following the transabdominal exam to visualize the ovaries and adnexal regions. Color and duplex Doppler ultrasound was utilized to evaluate blood flow to the ovaries. COMPARISON:  CT of the abdomen and pelvis 01/20/2017 FINDINGS: Uterus Measurements: Surgically absent. Vaginal cuff is unremarkable in appearance. Endometrium Thickness: Uterus is surgically absent. Right  ovary Measurements: 3.1 x 2.1 x 1.8 cm. Normal appearance/no adnexal mass. Left ovary Measurements: 4.6 x 2.2 x 1.6 cm. Small follicles are present, largest measuring 1.5 cm. Pulsed Doppler evaluation of both ovaries demonstrates normal low-resistance arterial and venous waveforms. Other findings Small amount of fluid in the left adnexal region. IMPRESSION: 1. Status post hysterectomy. 2. Normal appearance of both ovaries. 3. No adnexal mass or vaginal cuff abnormality. Electronically Signed   By: Nolon Nations M.D.   On: 01/20/2017 09:35   US Pelvis Complete  Result Date: 01/20/2017 CLINICAL DATA:  Vomiting for 3 days. Left lower quadrant pain on palpation. Previous hysterectomy 2013 for cervical cancer. Gravida 1 para 1. Negative CT today. LMP 2013. EXAM: TRANSABDOMINAL AND TRANSVAGINAL ULTRASOUND OF PELVIS DOPPLER ULTRASOUND OF OVARIES TECHNIQUE: Both transabdominal and transvaginal ultrasound examinations of the pelvis were performed. Transabdominal technique was performed for global imaging of the pelvis including uterus, ovaries, adnexal regions, and pelvic cul-de-sac. It was  necessary to proceed with endovaginal exam following the transabdominal exam to visualize the ovaries and adnexal regions. Color and duplex Doppler ultrasound was utilized to evaluate blood flow to the ovaries. COMPARISON:  CT of the abdomen and pelvis 01/20/2017 FINDINGS: Uterus Measurements: Surgically absent. Vaginal cuff is unremarkable in appearance. Endometrium Thickness: Uterus is surgically absent. Right ovary Measurements: 3.1 x 2.1 x 1.8 cm. Normal appearance/no adnexal mass. Left ovary Measurements: 4.6 x 2.2 x 1.6 cm. Small follicles are present, largest measuring 1.5 cm. Pulsed Doppler evaluation of both ovaries demonstrates normal low-resistance arterial and venous waveforms. Other findings Small amount of fluid in the left adnexal region. IMPRESSION: 1. Status post hysterectomy. 2. Normal appearance of both ovaries. 3. No adnexal mass or vaginal cuff abnormality. Electronically Signed   By: Nolon Nations M.D.   On: 01/20/2017 09:35   Ct Abdomen Pelvis W Contrast  Result Date: 01/20/2017 CLINICAL DATA:  Acute onset of vomiting after eating chicken. EXAM: CT ABDOMEN AND PELVIS WITH CONTRAST TECHNIQUE: Multidetector CT imaging of the abdomen and pelvis was performed using the standard protocol following bolus administration of intravenous contrast. CONTRAST:  140mL ISOVUE-300 IOPAMIDOL (ISOVUE-300) INJECTION 61% COMPARISON:  CT of the abdomen and pelvis from 03/18/2007, and abdominal ultrasound performed 03/17/2007 FINDINGS: Lower chest: Wall thickening at the distal esophagus raises concern for esophagitis. The visualized portions of the mediastinum are unremarkable. The visualized lung bases are clear. Hepatobiliary: The liver is unremarkable in appearance. The gallbladder is unremarkable in appearance. The common bile duct remains normal in caliber. Pancreas: The pancreas is within normal limits. Spleen: The spleen is unremarkable in appearance. Adrenals/Urinary Tract: The adrenal glands are  unremarkable in appearance. The kidneys are within normal limits. There is no evidence of hydronephrosis. No renal or ureteral stones are identified. No perinephric stranding is seen. Set Stomach/Bowel: The stomach is unremarkable in appearance. The small bowel is within normal limits. The appendix is normal in caliber, without evidence of appendicitis. The colon is unremarkable in appearance. Vascular/Lymphatic: The abdominal aorta is unremarkable in appearance. The inferior vena cava is grossly unremarkable. No retroperitoneal lymphadenopathy is seen. No pelvic sidewall lymphadenopathy is identified. Reproductive: The bladder is mildly distended and grossly unremarkable. The patient is status post hysterectomy. The ovaries are relatively symmetric. No suspicious adnexal masses are seen. Other: No additional soft tissue abnormalities are seen. Musculoskeletal: No acute osseous abnormalities are identified. The visualized musculature is unremarkable in appearance. IMPRESSION: 1. Wall thickening along the distal esophagus raises concern for esophagitis. This is similar to  its appearance in 2008, and may be chronic in nature, or may reflect a recurrent process. Would correlate with the patient's symptoms. 2. Otherwise unremarkable contrast-enhanced CT of the abdomen and pelvis. Electronically Signed   By: Garald Balding M.D.   On: 01/20/2017 07:01   [place] Art/ven Flow Abd Pelv Doppler  Result Date: 01/20/2017 CLINICAL DATA:  Vomiting for 3 days. Left lower quadrant pain on palpation. Previous hysterectomy 2013 for cervical cancer. Gravida 1 para 1. Negative CT today. LMP 2013. EXAM: TRANSABDOMINAL AND TRANSVAGINAL ULTRASOUND OF PELVIS DOPPLER ULTRASOUND OF OVARIES TECHNIQUE: Both transabdominal and transvaginal ultrasound examinations of the pelvis were performed. Transabdominal technique was performed for global imaging of the pelvis including uterus, ovaries, adnexal regions, and pelvic cul-de-sac. It was  necessary to proceed with endovaginal exam following the transabdominal exam to visualize the ovaries and adnexal regions. Color and duplex Doppler ultrasound was utilized to evaluate blood flow to the ovaries. COMPARISON:  CT of the abdomen and pelvis 01/20/2017 FINDINGS: Uterus Measurements: Surgically absent. Vaginal cuff is unremarkable in appearance. Endometrium Thickness: Uterus is surgically absent. Right ovary Measurements: 3.1 x 2.1 x 1.8 cm. Normal appearance/no adnexal mass. Left ovary Measurements: 4.6 x 2.2 x 1.6 cm. Small follicles are present, largest measuring 1.5 cm. Pulsed Doppler evaluation of both ovaries demonstrates normal low-resistance arterial and venous waveforms. Other findings Small amount of fluid in the left adnexal region. IMPRESSION: 1. Status post hysterectomy. 2. Normal appearance of both ovaries. 3. No adnexal mass or vaginal cuff abnormality. Electronically Signed   By: Nolon Nations M.D.   On: 01/20/2017 09:35    EKG: Independently reviewed. NSR. No STEMI.  Assessment/Plan Active Problems:   Intractable nausea and vomiting  Intractable nausea and vomiting UDS positive for marijuana & bzs Negative Img Negative chest x-ray and UA Zofran prn  Esophagitis on CT Pepcid BID  Code Status: FC  DVT Prophylaxis: SCDs Family Communication: none at bedside Disposition Plan: Pending Improvement  Status: obs tele  Elwin Mocha, MD Family Medicine [facility] [email or web address] Password [email or web address]

## 2017-01-20 NOTE — ED Provider Notes (Signed)
[facility] DEPT Provider Note   CSN: [id number] Arrival date & time: 01/20/17  0255     History   Chief Complaint Chief Complaint  Patient presents with  . Vomiting    HPI Shelby Howard is a 46 y.o. female.  Patient presents to the emergency department with chief complaint of vomiting.  She reports that she has been vomiting since Friday.  She reports seeing some blood-tinged vomit.  She denies any fevers, chills, diarrhea.  She denies any abdominal pain, except for immediately preceding the vomiting.  She denies having taken anything for symptoms.  She states that she is a vegan, but ate meat this week; she thinks that this caused the vomiting.    The history is provided by the patient. No language interpreter was used.    Past Medical History:  Diagnosis Date  . Anemia    history  . Anxiety   . Bipolar disorder ([place])   . Depression   . 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 Fibroids   . Hypertension   . Insomnia     Patient Active Problem List   Diagnosis Date Noted  . Alcohol use disorder, mild, abuse 11/12/2015  . Bipolar I disorder, most recent episode depressed ([place]) 11/12/2015  . Fibroids   . Bipolar disorder ([place])   . Anxiety   . Depression   . Insomnia   . 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 CERVICAL CONIZATION W/BX  03/02/2011   Procedure: CONIZATION CERVIX WITH BIOPSY;  Surgeon: Betsy Coder, MD;  Location: [facility];  Service: Gynecology;  Laterality: N/A;  Cold Knife Conization, Endocervical Curretage  . CYSTOSCOPY  09/06/2011   Procedure: CYSTOSCOPY;  Surgeon: Betsy Coder, MD;  Location: [facility];  Service: Gynecology;  Laterality: N/A;  . DILATION AND CURETTAGE OF UTERUS  09/2007   resection of polyp  . LAPAROSCOPIC HYSTERECTOMY  09/06/2011   Procedure: HYSTERECTOMY TOTAL LAPAROSCOPIC;  Surgeon: Betsy Coder,  MD;  Location: [facility];  Service: Gynecology;  Laterality: N/A;  . SVD  1998   x 1    OB History    Gravida Para Term Preterm AB Living   1 1       1    SAB TAB Ectopic Multiple Live Births                   Home Medications    Prior to Admission medications   Medication Sig Start Date End Date Taking? Authorizing Provider  ALPRAZolam Duanne Moron) 1 MG tablet Take 1 mg by mouth 2 (two) times daily as needed for anxiety. 05/30/16  Yes [provider]  citalopram (CELEXA) 20 MG tablet Take 1 tablet (20 mg total) by mouth daily. For depression Patient taking differently: Take 20 mg by mouth at bedtime. For depression 11/15/15  Yes Nwoko, Herbert Pun I, NP  lurasidone (LATUDA) 40 MG TABS tablet Take 1 tablet (40 mg total) by mouth daily with breakfast. For mood control Patient taking differently: Take 40 mg by mouth at bedtime. For mood control 11/15/15  Yes Lindell Spar I, NP  zaleplon (SONATA) 5 MG capsule Take 5 mg by mouth at bedtime as needed for sleep. If needed after taking ambien 10/21/15  Yes [provider]  zolpidem (AMBIEN) 10 MG tablet Take 1 tablet (10 mg total) by mouth at bedtime as needed for sleep. 11/15/15  Yes Lindell Spar I, NP  gabapentin (NEURONTIN) 100 MG capsule Take 1 capsule (100 mg total) by mouth 3 (three) times daily. For agitation Patient not taking: Reported on 06/12/2016 11/15/15   Lindell Spar I, NP  lisinopril (PRINIVIL,ZESTRIL) 5 MG tablet Take 1 tablet (5 mg total) by mouth daily. For high blood pressure Patient not taking: Reported on 01/20/2017 11/15/15   Encarnacion Slates, NP    Family History Family History  Problem Relation Age of Onset  . Hypertension Mother   . Bipolar disorder Mother   . Bipolar disorder Sister     Social History Social History  Substance Use Topics  . Smoking status: Never Smoker  . Smokeless tobacco: Never Used  . Alcohol use 2.4 oz/week    4 Glasses of wine per week     Comment: weekly -wine     Allergies     Patient has no known allergies.   Review of Systems Review of Systems  All other systems reviewed and are negative.    Physical Exam Updated Vital Signs BP (!) 163/93 (BP Location: Left Arm)   Pulse 83   Temp 98.2 F (36.8 C) (Oral)   Resp 17   Ht 5' 6.5" (1.689 m)   Wt 124.3 kg (274 lb)   LMP 08/07/2011   SpO2 100%   BMI 43.56 kg/m   Physical Exam  Constitutional: She is oriented to person, place, and time. She appears well-developed and well-nourished.  HENT:  Head: Normocephalic and atraumatic.  Eyes: Pupils are equal, round, and reactive to light. Conjunctivae and EOM are normal.  Neck: Normal range of motion. Neck supple.  Cardiovascular: Normal rate and regular rhythm.  Exam reveals no gallop and no friction rub.   No murmur heard. Pulmonary/Chest: Effort normal and breath sounds normal. No respiratory distress. She has no wheezes. She has no rales. She exhibits no tenderness.  Abdominal: Soft. Bowel sounds are normal. She exhibits no distension and no mass. There is no tenderness. There is no rebound and no guarding.  No focal abdominal tenderness, no RLQ tenderness or pain at McBurney's point, no RUQ tenderness or Murphy's sign, no left-sided abdominal tenderness, no fluid wave, or signs of peritonitis   Musculoskeletal: Normal range of motion. She exhibits no edema or tenderness.  Neurological: She is alert and oriented to person, place, and time.  Skin: Skin is warm and dry.  Psychiatric: She has a normal mood and affect. Her behavior is normal. Judgment and thought content normal.  Nursing note and vitals reviewed.    ED Treatments / Results  Labs (all labs ordered are listed, but only abnormal results are displayed) Labs Reviewed  LIPASE, BLOOD  COMPREHENSIVE METABOLIC PANEL  CBC  URINALYSIS, ROUTINE W REFLEX MICROSCOPIC    EKG  EKG Interpretation None       Radiology No results found.  Procedures Procedures (including critical care  time)  Medications Ordered in ED Medications  ondansetron (ZOFRAN-ODT) disintegrating tablet 4 mg (4 mg Oral Given 01/20/17 0330)  sodium chloride 0.9 % bolus 1,000 mL (1,000 mLs Intravenous New Bag/Given 01/20/17 0340)     Initial Impression / Assessment and Plan / ED Course  I have reviewed the triage vital signs and the nursing notes.  Pertinent labs & imaging results that were available during my care of the patient were reviewed by me and considered in my medical decision making (see chart for details).  Clinical Course as of Jan 20 614  Sun Jan 20, 2017  0612 Took patient care handoff report from [employer], [place]. Fluids, pain control, antiemetics, CT abdomen  [SJ]    Clinical Course User Index [SJ] Joy, Shawn C, PA-C    Patient with vomiting times 3 days.  No focal abdominal tenderness on exam.  Vital signs are stable.  Patient is well-appearing.  Will check labs, give fluids, treat symptoms, and reassess.  Leukocytosis of 20.4.  Patient still vomiting.  Anion gap of 21.  Will give more fluids.  Will check lactate and ethanol.    Patient signed out to Olena Mater, who will continue care.  Final Clinical Impressions(s) / ED Diagnoses   Final diagnoses:  None    New Prescriptions New Prescriptions   No medications on file     Montine Circle, Hershal Coria 01/20/17 5102    Molpus, Jenny Reichmann, MD 01/20/17 [id number]

## 2017-01-21 DIAGNOSIS — A084 Viral intestinal infection, unspecified: Secondary | ICD-10-CM | POA: Diagnosis present

## 2017-01-21 DIAGNOSIS — K210 Gastro-esophageal reflux disease with esophagitis: Secondary | ICD-10-CM | POA: Diagnosis present

## 2017-01-21 DIAGNOSIS — G4700 Insomnia, unspecified: Secondary | ICD-10-CM | POA: Diagnosis present

## 2017-01-21 DIAGNOSIS — Z6841 Body Mass Index (BMI) 40.0 and over, adult: Secondary | ICD-10-CM | POA: Diagnosis not present

## 2017-01-21 DIAGNOSIS — R112 Nausea with vomiting, unspecified: Secondary | ICD-10-CM | POA: Diagnosis present

## 2017-01-21 DIAGNOSIS — Z8541 Personal history of malignant neoplasm of cervix uteri: Secondary | ICD-10-CM | POA: Diagnosis not present

## 2017-01-21 DIAGNOSIS — F319 Bipolar disorder, unspecified: Secondary | ICD-10-CM | POA: Diagnosis present

## 2017-01-21 DIAGNOSIS — Z79899 Other long term (current) drug therapy: Secondary | ICD-10-CM | POA: Diagnosis not present

## 2017-01-21 DIAGNOSIS — K219 Gastro-esophageal reflux disease without esophagitis: Secondary | ICD-10-CM | POA: Diagnosis not present

## 2017-01-21 DIAGNOSIS — F419 Anxiety disorder, unspecified: Secondary | ICD-10-CM | POA: Diagnosis present

## 2017-01-21 DIAGNOSIS — I10 Essential (primary) hypertension: Secondary | ICD-10-CM | POA: Diagnosis present

## 2017-01-21 DIAGNOSIS — E876 Hypokalemia: Secondary | ICD-10-CM | POA: Diagnosis present

## 2017-01-21 LAB — GC/CHLAMYDIA PROBE AMP (~~LOC~~) NOT AT ARMC
Chlamydia: NEGATIVE
Neisseria Gonorrhea: NEGATIVE

## 2017-01-21 LAB — BASIC METABOLIC PANEL
ANION GAP: 14 (ref 5–15)
BUN: 7 mg/dL (ref 6–20)
CALCIUM: 8.8 mg/dL — AB (ref 8.9–10.3)
CO2: 23 mmol/L (ref 22–32)
Chloride: 102 mmol/L (ref 101–111)
Creatinine, Ser: 0.65 mg/dL (ref 0.44–1.00)
Glucose, Bld: 123 mg/dL — ABNORMAL HIGH (ref 65–99)
POTASSIUM: 3.1 mmol/L — AB (ref 3.5–5.1)
Sodium: 139 mmol/L (ref 135–145)

## 2017-01-21 LAB — CBC
HCT: 37.2 % (ref 36.0–46.0)
HEMOGLOBIN: 12.5 g/dL (ref 12.0–15.0)
MCH: 28 pg (ref 26.0–34.0)
MCHC: 33.6 g/dL (ref 30.0–36.0)
MCV: 83.2 fL (ref 78.0–100.0)
Platelets: 278 10*3/uL (ref 150–400)
RBC: 4.47 MIL/uL (ref 3.87–5.11)
RDW: 14.8 % (ref 11.5–15.5)
WBC: 15.4 10*3/uL — AB (ref 4.0–10.5)

## 2017-01-21 LAB — HIV ANTIBODY (ROUTINE TESTING W REFLEX): HIV Screen 4th Generation wRfx: NONREACTIVE

## 2017-01-21 LAB — RPR: RPR Ser Ql: NONREACTIVE

## 2017-01-21 MED ORDER — HYDROMORPHONE HCL 1 MG/ML IJ SOLN: 0.5000 mg | INTRAMUSCULAR | Status: DC | PRN

## 2017-01-21 MED ORDER — PROMETHAZINE HCL 25 MG/ML IJ SOLN
12.5000 mg | Freq: Once | INTRAMUSCULAR | Status: AC
  Administered 2017-01-21: 12.5 mg via INTRAVENOUS
  Filled 2017-01-21: qty 1

## 2017-01-21 MED ORDER — METOCLOPRAMIDE HCL 5 MG/ML IJ SOLN: 5.0000 mg | Freq: Three times a day (TID) | INTRAMUSCULAR | Status: DC | PRN

## 2017-01-21 MED ORDER — POTASSIUM CHLORIDE CRYS ER 20 MEQ PO TBCR: 40.0000 meq | EXTENDED_RELEASE_TABLET | Freq: Once | ORAL | Status: DC

## 2017-01-21 MED ORDER — KETOROLAC TROMETHAMINE 30 MG/ML IJ SOLN
30.0000 mg | Freq: Once | INTRAMUSCULAR | Status: AC
  Administered 2017-01-21: 30 mg via INTRAVENOUS
  Filled 2017-01-21: qty 1

## 2017-01-21 MED ORDER — METOCLOPRAMIDE HCL 5 MG/ML IJ SOLN
5.0000 mg | Freq: Once | INTRAMUSCULAR | Status: AC
  Administered 2017-01-21: 5 mg via INTRAVENOUS
  Filled 2017-01-21: qty 2

## 2017-01-21 MED ORDER — PANTOPRAZOLE SODIUM 40 MG IV SOLR
40.0000 mg | Freq: Two times a day (BID) | INTRAVENOUS | Status: DC
  Administered 2017-01-21 – 2017-01-23 (×5): 40 mg via INTRAVENOUS
  Filled 2017-01-21 (×3): qty 40

## 2017-01-21 MED ORDER — POTASSIUM CHLORIDE 10 MEQ/100ML IV SOLN
10.0000 meq | INTRAVENOUS | Status: AC
  Administered 2017-01-21 (×4): 10 meq via INTRAVENOUS
  Filled 2017-01-21 (×4): qty 100

## 2017-01-21 MED ORDER — HYDRALAZINE HCL 20 MG/ML IJ SOLN
5.0000 mg | INTRAMUSCULAR | Status: DC | PRN
  Administered 2017-01-21 – 2017-01-22 (×3): 5 mg via INTRAVENOUS
  Filled 2017-01-21 (×3): qty 1

## 2017-01-21 MED ORDER — ZOLPIDEM TARTRATE 10 MG PO TABS
10.0000 mg | ORAL_TABLET | Freq: Every evening | ORAL | Status: DC | PRN
  Administered 2017-01-21 – 2017-01-22 (×2): 10 mg via ORAL
  Filled 2017-01-21 (×2): qty 1

## 2017-01-21 NOTE — Progress Notes (Signed)
I have received report and have taken over care from the previous nurse. The assessment has been reviewed and I am in agreement with it. Patient in stable condition.

## 2017-01-21 NOTE — Progress Notes (Signed)
Patient ID: Shelby Howard, female   DOB: 10-May-1970, 46 y.o.   MRN: [id number]    PROGRESS NOTE    Aleysha Meckler  IZT:[id number] DOB: [birth year] DOA: 01/20/2017  PCP: Crawford Givens, MD   Brief Narrative:  Pt is 46 yo female who presented with ongoing nausea and non bloody vomiting.   Assessment & Plan:   Active Problems:   Intractable nausea and vomiting - unclear etiology - suspect related to ? THC use, on CT scan also ? Esophagitis but appears chronic in nature - currently no vomiting - discussed with pt THC cessation, she does not believe this is the cause - keep on PPI for now, IVF, analgesia and antiemetics as needed - slowly advance diet - if not better,? GI consult     HTN - place on hydralazine as needed for now     Leukocytosis - suspect reactive - trending down - CBC in AM    Hypokalemia - supplement and repeat BMP in AM    Morbid obesity  -Body mass index is 43.38 kg/m.   DVT prophylaxis: SCD's Code Status: Full  Family Communication: Patient at bedside  Disposition Plan: to be determined   Consultants:   None  Procedures:   None  Antimicrobials:   None  Subjective: Still with nausea but no vomiting.   Objective: Vitals:   01/20/17 1558 01/20/17 2142 01/21/17 0500 01/21/17 0525  BP: (!) 154/94 (!) 160/85  (!) 170/94  Pulse: 95 79  83  Resp:  18  18  Temp: 98.5 F (36.9 C) 98.2 F (36.8 C)  97.7 F (36.5 C)  TempSrc: Oral Oral  Oral  SpO2: 98% 99%    Weight: 123.3 kg (271 lb 12.8 oz)  121.9 kg (268 lb 11.9 oz)   Height: 5\' 6"  (1.676 m)       Intake/Output Summary (Last 24 hours) at 01/21/17 1302 Last data filed at 01/21/17 0200  Gross per 24 hour  Intake           953.33 ml  Output                0 ml  Net           953.33 ml   Filed Weights   01/20/17 0331 01/20/17 1558 01/21/17 0500  Weight: 124.3 kg (274 lb) 123.3 kg (271 lb 12.8 oz) 121.9 kg (268 lb 11.9 oz)    Examination:  General exam: Appears calm and  comfortable  Respiratory system: Clear to auscultation. Respiratory effort normal. Cardiovascular system: S1 & S2 heard, RRR. No JVD, murmurs, rubs, gallops or clicks. No pedal edema. Gastrointestinal system: Abdomen is nondistended, soft and nontender. No organomegaly or masses felt. Normal bowel sounds heard. Central nervous system: Alert and oriented. No focal neurological deficits. Extremities: Symmetric 5 x 5 power.   Data Reviewed: I have personally reviewed following labs and imaging studies  CBC:  Recent Labs Lab 01/20/17 0333 01/21/17 0452  WBC 20.4* 15.4*  HGB 12.7 12.5  HCT 37.4 37.2  MCV 82.2 83.2  PLT 308 382   Basic Metabolic Panel:  Recent Labs Lab 01/20/17 0333 01/21/17 0452  NA 138 139  K 3.2* 3.1*  CL 97* 102  CO2 20* 23  GLUCOSE 144* 123*  BUN 15 7  CREATININE 0.94 0.65  CALCIUM 9.0 8.8*   Liver Function Tests:  Recent Labs Lab 01/20/17 0333  AST 75*  ALT 26  ALKPHOS 86  BILITOT 1.3*  PROT 8.6*  ALBUMIN 4.2  Recent Labs Lab 01/20/17 0333  LIPASE 22   Urine analysis:    Component Value Date/Time   COLORURINE AMBER (A) 01/20/2017 0333   APPEARANCEUR HAZY (A) 01/20/2017 0333   LABSPEC 1.030 01/20/2017 0333   PHURINE 6.0 01/20/2017 0333   GLUCOSEU NEGATIVE 01/20/2017 0333   HGBUR MODERATE (A) 01/20/2017 0333   BILIRUBINUR NEGATIVE 01/20/2017 0333   BILIRUBINUR NEG 09/14/2011 0845   KETONESUR 5 (A) 01/20/2017 0333   PROTEINUR >=300 (A) 01/20/2017 0333   UROBILINOGEN negative 09/14/2011 0845   UROBILINOGEN 0.2 03/17/2007 2036   NITRITE NEGATIVE 01/20/2017 0333   LEUKOCYTESUR NEGATIVE 01/20/2017 0333   Recent Results (from the past 240 hour(s))  Wet prep, genital     Status: None   Collection Time: 01/20/17 10:45 AM  Result Value Ref Range Status   Yeast Wet Prep HPF POC NONE SEEN NONE SEEN Final   Trich, Wet Prep NONE SEEN NONE SEEN Final   Clue Cells Wet Prep HPF POC NONE SEEN NONE SEEN Final   WBC, Wet Prep HPF POC NONE  SEEN NONE SEEN Final   Sperm NONE SEEN  Final     Radiology Studies: US Transvaginal Non-ob  Result Date: 01/20/2017 CLINICAL DATA:  Vomiting for 3 days. Left lower quadrant pain on palpation. Previous hysterectomy 2013 for cervical cancer. Gravida 1 para 1. Negative CT today. LMP 2013. EXAM: TRANSABDOMINAL AND TRANSVAGINAL ULTRASOUND OF PELVIS DOPPLER ULTRASOUND OF OVARIES TECHNIQUE: Both transabdominal and transvaginal ultrasound examinations of the pelvis were performed. Transabdominal technique was performed for global imaging of the pelvis including uterus, ovaries, adnexal regions, and pelvic cul-de-sac. It was necessary to proceed with endovaginal exam following the transabdominal exam to visualize the ovaries and adnexal regions. Color and duplex Doppler ultrasound was utilized to evaluate blood flow to the ovaries. COMPARISON:  CT of the abdomen and pelvis 01/20/2017 FINDINGS: Uterus Measurements: Surgically absent. Vaginal cuff is unremarkable in appearance. Endometrium Thickness: Uterus is surgically absent. Right ovary Measurements: 3.1 x 2.1 x 1.8 cm. Normal appearance/no adnexal mass. Left ovary Measurements: 4.6 x 2.2 x 1.6 cm. Small follicles are present, largest measuring 1.5 cm. Pulsed Doppler evaluation of both ovaries demonstrates normal low-resistance arterial and venous waveforms. Other findings Small amount of fluid in the left adnexal region. IMPRESSION: 1. Status post hysterectomy. 2. Normal appearance of both ovaries. 3. No adnexal mass or vaginal cuff abnormality. Electronically Signed   By: Nolon Nations M.D.   On: 01/20/2017 09:35   US Pelvis Complete  Result Date: 01/20/2017 CLINICAL DATA:  Vomiting for 3 days. Left lower quadrant pain on palpation. Previous hysterectomy 2013 for cervical cancer. Gravida 1 para 1. Negative CT today. LMP 2013. EXAM: TRANSABDOMINAL AND TRANSVAGINAL ULTRASOUND OF PELVIS DOPPLER ULTRASOUND OF OVARIES TECHNIQUE: Both transabdominal and  transvaginal ultrasound examinations of the pelvis were performed. Transabdominal technique was performed for global imaging of the pelvis including uterus, ovaries, adnexal regions, and pelvic cul-de-sac. It was necessary to proceed with endovaginal exam following the transabdominal exam to visualize the ovaries and adnexal regions. Color and duplex Doppler ultrasound was utilized to evaluate blood flow to the ovaries. COMPARISON:  CT of the abdomen and pelvis 01/20/2017 FINDINGS: Uterus Measurements: Surgically absent. Vaginal cuff is unremarkable in appearance. Endometrium Thickness: Uterus is surgically absent. Right ovary Measurements: 3.1 x 2.1 x 1.8 cm. Normal appearance/no adnexal mass. Left ovary Measurements: 4.6 x 2.2 x 1.6 cm. Small follicles are present, largest measuring 1.5 cm. Pulsed Doppler evaluation of both ovaries demonstrates normal low-resistance arterial  and venous waveforms. Other findings Small amount of fluid in the left adnexal region. IMPRESSION: 1. Status post hysterectomy. 2. Normal appearance of both ovaries. 3. No adnexal mass or vaginal cuff abnormality. Electronically Signed   By: Nolon Nations M.D.   On: 01/20/2017 09:35   Ct Abdomen Pelvis W Contrast  Result Date: 01/20/2017 CLINICAL DATA:  Acute onset of vomiting after eating chicken. EXAM: CT ABDOMEN AND PELVIS WITH CONTRAST TECHNIQUE: Multidetector CT imaging of the abdomen and pelvis was performed using the standard protocol following bolus administration of intravenous contrast. CONTRAST:  184mL ISOVUE-300 IOPAMIDOL (ISOVUE-300) INJECTION 61% COMPARISON:  CT of the abdomen and pelvis from 03/18/2007, and abdominal ultrasound performed 03/17/2007 FINDINGS: Lower chest: Wall thickening at the distal esophagus raises concern for esophagitis. The visualized portions of the mediastinum are unremarkable. The visualized lung bases are clear. Hepatobiliary: The liver is unremarkable in appearance. The gallbladder is  unremarkable in appearance. The common bile duct remains normal in caliber. Pancreas: The pancreas is within normal limits. Spleen: The spleen is unremarkable in appearance. Adrenals/Urinary Tract: The adrenal glands are unremarkable in appearance. The kidneys are within normal limits. There is no evidence of hydronephrosis. No renal or ureteral stones are identified. No perinephric stranding is seen. Set Stomach/Bowel: The stomach is unremarkable in appearance. The small bowel is within normal limits. The appendix is normal in caliber, without evidence of appendicitis. The colon is unremarkable in appearance. Vascular/Lymphatic: The abdominal aorta is unremarkable in appearance. The inferior vena cava is grossly unremarkable. No retroperitoneal lymphadenopathy is seen. No pelvic sidewall lymphadenopathy is identified. Reproductive: The bladder is mildly distended and grossly unremarkable. The patient is status post hysterectomy. The ovaries are relatively symmetric. No suspicious adnexal masses are seen. Other: No additional soft tissue abnormalities are seen. Musculoskeletal: No acute osseous abnormalities are identified. The visualized musculature is unremarkable in appearance. IMPRESSION: 1. Wall thickening along the distal esophagus raises concern for esophagitis. This is similar to its appearance in 2008, and may be chronic in nature, or may reflect a recurrent process. Would correlate with the patient's symptoms. 2. Otherwise unremarkable contrast-enhanced CT of the abdomen and pelvis. Electronically Signed   By: Garald Balding M.D.   On: 01/20/2017 07:01   [place] Art/ven Flow Abd Pelv Doppler  Result Date: 01/20/2017 CLINICAL DATA:  Vomiting for 3 days. Left lower quadrant pain on palpation. Previous hysterectomy 2013 for cervical cancer. Gravida 1 para 1. Negative CT today. LMP 2013. EXAM: TRANSABDOMINAL AND TRANSVAGINAL ULTRASOUND OF PELVIS DOPPLER ULTRASOUND OF OVARIES TECHNIQUE: Both transabdominal and  transvaginal ultrasound examinations of the pelvis were performed. Transabdominal technique was performed for global imaging of the pelvis including uterus, ovaries, adnexal regions, and pelvic cul-de-sac. It was necessary to proceed with endovaginal exam following the transabdominal exam to visualize the ovaries and adnexal regions. Color and duplex Doppler ultrasound was utilized to evaluate blood flow to the ovaries. COMPARISON:  CT of the abdomen and pelvis 01/20/2017 FINDINGS: Uterus Measurements: Surgically absent. Vaginal cuff is unremarkable in appearance. Endometrium Thickness: Uterus is surgically absent. Right ovary Measurements: 3.1 x 2.1 x 1.8 cm. Normal appearance/no adnexal mass. Left ovary Measurements: 4.6 x 2.2 x 1.6 cm. Small follicles are present, largest measuring 1.5 cm. Pulsed Doppler evaluation of both ovaries demonstrates normal low-resistance arterial and venous waveforms. Other findings Small amount of fluid in the left adnexal region. IMPRESSION: 1. Status post hysterectomy. 2. Normal appearance of both ovaries. 3. No adnexal mass or vaginal cuff abnormality. Electronically Signed  By: Nolon Nations M.D.   On: 01/20/2017 09:35    Scheduled Meds: . famotidine  20 mg Oral BID   Continuous Infusions: . sodium chloride 100 mL/hr at 01/21/17 0510  . potassium chloride 10 mEq (01/21/17 1236)     LOS: 0 days   Time spent: 25 minutes   Faye Ramsay, MD [facility] Pager [phone]  If 7PM-7AM, please contact night-coverage [email or web address] Password [email or web address] 01/21/2017, 1:02 PM

## 2017-01-22 LAB — BASIC METABOLIC PANEL
Anion gap: 14 (ref 5–15)
BUN: 7 mg/dL (ref 6–20)
CALCIUM: 8.8 mg/dL — AB (ref 8.9–10.3)
CHLORIDE: 101 mmol/L (ref 101–111)
CO2: 19 mmol/L — AB (ref 22–32)
CREATININE: 0.69 mg/dL (ref 0.44–1.00)
GFR calc non Af Amer: >60 mL/min (ref >60)
Glucose, Bld: 111 mg/dL — ABNORMAL HIGH (ref 65–99)
Potassium: 3.1 mmol/L — ABNORMAL LOW (ref 3.5–5.1)
Sodium: 134 mmol/L — ABNORMAL LOW (ref 135–145)

## 2017-01-22 LAB — CBC
HCT: 39.2 % (ref 36.0–46.0)
HEMOGLOBIN: 13.2 g/dL (ref 12.0–15.0)
MCH: 28 pg (ref 26.0–34.0)
MCHC: 33.7 g/dL (ref 30.0–36.0)
MCV: 83.1 fL (ref 78.0–100.0)
Platelets: 291 10*3/uL (ref 150–400)
RBC: 4.72 MIL/uL (ref 3.87–5.11)
RDW: 14.4 % (ref 11.5–15.5)
WBC: 17.8 10*3/uL — ABNORMAL HIGH (ref 4.0–10.5)

## 2017-01-22 LAB — MAGNESIUM: Magnesium: 1.7 mg/dL (ref 1.7–2.4)

## 2017-01-22 MED ORDER — ALPRAZOLAM 1 MG PO TABS
1.0000 mg | ORAL_TABLET | Freq: Two times a day (BID) | ORAL | Status: DC | PRN
  Administered 2017-01-22: 1 mg via ORAL
  Filled 2017-01-22: qty 1

## 2017-01-22 MED ORDER — CITALOPRAM HYDROBROMIDE 20 MG PO TABS
20.0000 mg | ORAL_TABLET | Freq: Every day | ORAL | Status: DC
  Administered 2017-01-22: 20 mg via ORAL
  Filled 2017-01-22: qty 1

## 2017-01-22 MED ORDER — LURASIDONE HCL 40 MG PO TABS
40.0000 mg | ORAL_TABLET | Freq: Every day | ORAL | Status: DC
  Administered 2017-01-22: 40 mg via ORAL
  Filled 2017-01-22: qty 1

## 2017-01-22 MED ORDER — HYDRALAZINE HCL 20 MG/ML IJ SOLN
10.0000 mg | INTRAMUSCULAR | Status: DC | PRN
  Administered 2017-01-23 (×2): 10 mg via INTRAVENOUS
  Filled 2017-01-22 (×2): qty 1

## 2017-01-22 MED ORDER — HYDRALAZINE HCL 20 MG/ML IJ SOLN
10.0000 mg | Freq: Once | INTRAMUSCULAR | Status: AC
  Administered 2017-01-22: 10 mg via INTRAVENOUS
  Filled 2017-01-22: qty 1

## 2017-01-22 NOTE — Progress Notes (Signed)
Patient ID: Shelby Howard, female   DOB: 1970-06-15, 46 y.o.   MRN: [id number]    PROGRESS NOTE   Kabria Hetzer  ZHG:[id number] DOB: [birth year] DOA: 01/20/2017  PCP: Crawford Givens, MD   Brief Narrative:  Pt is 46 yo female who presented with ongoing nausea and non bloody vomiting.   Assessment & Plan:   Active Problems:   Intractable nausea and vomiting - unclear etiology - suspect related to ? THC use, on CT scan also ? Esophagitis but appears chronic in nature - pt reports she ate some chicken outside and suspect this is the underlying problem  - currently no vomiting and she says she is ready to try some liquid diet  - discussed with pt THC cessation, she does not believe this is the cause - Continue PPI for now, antiemetics and analgesia as needed - I have discussed with patient consultation with GI, she prefers to hold off and sees specialist in an outpatient setting, see how she does with advance diet - monitor clinical status, consider GI consultation if patient not improving    HTN - reasonable inpatient control    Leukocytosis - suspect reactive - no fevers overnight, T 98.2 this AM - will repeat CBC in AM - hold off on ABX for now as no clear infectious etiology identified     Hypokalemia - still low, continue to supplement and repeat BMP in AM    Morbid obesity  -Body mass index is 43.87 kg/m.   DVT prophylaxis: SCD's Code Status: Full  Family Communication: Patient at bedside  Disposition Plan: to be determined   Consultants:   None  Procedures:   None  Antimicrobials:   None  Subjective: Somewhat better, wants to try clear liquids.   Objective: Vitals:   01/21/17 1835 01/21/17 2046 01/22/17 0453 01/22/17 0614  BP: (!) 172/78 (!) 177/111 (!) 159/93 138/90  Pulse:  96 97 (!) 101  Resp:  18 16 18   Temp:  99.5 F (37.5 C) 98.7 F (37.1 C) 98.2 F (36.8 C)  TempSrc:  Oral Oral Oral  SpO2:  100% 100% 100%  Weight:    123.3 kg (271 lb  13.2 oz)  Height:        Intake/Output Summary (Last 24 hours) at 01/22/17 1231 Last data filed at 01/22/17 0617  Gross per 24 hour  Intake          1595.42 ml  Output                2 ml  Net          1593.42 ml   Filed Weights   01/20/17 1558 01/21/17 0500 01/22/17 0614  Weight: 123.3 kg (271 lb 12.8 oz) 121.9 kg (268 lb 11.9 oz) 123.3 kg (271 lb 13.2 oz)    Physical Exam  Constitutional: Appears calm, NAD CVS: mild tachycardia, no murmurs  Pulmonary: Effort and breath sounds normal, no stridor, rhonchi, wheezes, rales.  Abdominal: Soft. BS +,  no distension, tenderness Musculoskeletal: Normal range of motion. No edema and no tenderness.  Neuro: Alert. Normal reflexes, muscle tone coordination. No cranial nerve deficit.  Data Reviewed: I have personally reviewed following labs and imaging studies  CBC:  Recent Labs Lab 01/20/17 0333 01/21/17 0452 01/22/17 0446  WBC 20.4* 15.4* 17.8*  HGB 12.7 12.5 13.2  HCT 37.4 37.2 39.2  MCV 82.2 83.2 83.1  PLT 308 278 196   Basic Metabolic Panel:  Recent Labs Lab 01/20/17 0333 01/21/17 0452  01/22/17 0446  NA 138 139 134*  K 3.2* 3.1* 3.1*  CL 97* 102 101  CO2 20* 23 19*  GLUCOSE 144* 123* 111*  BUN 15 7 7   CREATININE 0.94 0.65 0.69  CALCIUM 9.0 8.8* 8.8*  MG  --   --  1.7   Liver Function Tests:  Recent Labs Lab 01/20/17 0333  AST 75*  ALT 26  ALKPHOS 86  BILITOT 1.3*  PROT 8.6*  ALBUMIN 4.2    Recent Labs Lab 01/20/17 0333  LIPASE 22   Urine analysis:    Component Value Date/Time   COLORURINE AMBER (A) 01/20/2017 0333   APPEARANCEUR HAZY (A) 01/20/2017 0333   LABSPEC 1.030 01/20/2017 0333   PHURINE 6.0 01/20/2017 0333   GLUCOSEU NEGATIVE 01/20/2017 0333   HGBUR MODERATE (A) 01/20/2017 0333   BILIRUBINUR NEGATIVE 01/20/2017 0333   BILIRUBINUR NEG 09/14/2011 0845   KETONESUR 5 (A) 01/20/2017 0333   PROTEINUR >=300 (A) 01/20/2017 0333   UROBILINOGEN negative 09/14/2011 0845   UROBILINOGEN 0.2  03/17/2007 2036   NITRITE NEGATIVE 01/20/2017 0333   LEUKOCYTESUR NEGATIVE 01/20/2017 0333   Recent Results (from the past 240 hour(s))  Wet prep, genital     Status: None   Collection Time: 01/20/17 10:45 AM  Result Value Ref Range Status   Yeast Wet Prep HPF POC NONE SEEN NONE SEEN Final   Trich, Wet Prep NONE SEEN NONE SEEN Final   Clue Cells Wet Prep HPF POC NONE SEEN NONE SEEN Final   WBC, Wet Prep HPF POC NONE SEEN NONE SEEN Final   Sperm NONE SEEN  Final    Radiology Studies: No results found.  Scheduled Meds: . pantoprazole (PROTONIX) IV  40 mg Intravenous Q12H   Continuous Infusions: . sodium chloride 75 mL/hr at 01/22/17 0329     LOS: 1 day   Time spent: 25 minutes   Faye Ramsay, MD [facility] Pager [phone]  If 7PM-7AM, please contact night-coverage [email or web address] Password [email or web address] 01/22/2017, 12:31 PM

## 2017-01-23 DIAGNOSIS — K219 Gastro-esophageal reflux disease without esophagitis: Secondary | ICD-10-CM

## 2017-01-23 DIAGNOSIS — I10 Essential (primary) hypertension: Secondary | ICD-10-CM

## 2017-01-23 DIAGNOSIS — R112 Nausea with vomiting, unspecified: Secondary | ICD-10-CM

## 2017-01-23 DIAGNOSIS — E876 Hypokalemia: Secondary | ICD-10-CM

## 2017-01-23 MED ORDER — ONDANSETRON 8 MG PO TBDP: 8.0000 mg | ORAL_TABLET | Freq: Three times a day (TID) | ORAL | 0 refills | Status: DC | PRN

## 2017-01-23 MED ORDER — HYDRALAZINE HCL 25 MG PO TABS: 25.0000 mg | ORAL_TABLET | Freq: Three times a day (TID) | ORAL | 1 refills | Status: DC

## 2017-01-23 MED ORDER — LISINOPRIL 5 MG PO TABS: 5.0000 mg | ORAL_TABLET | Freq: Every day | ORAL | 1 refills | Status: DC

## 2017-01-23 MED ORDER — PANTOPRAZOLE SODIUM 40 MG PO TBEC: 40.0000 mg | DELAYED_RELEASE_TABLET | Freq: Every day | ORAL | 1 refills | Status: DC

## 2017-01-23 NOTE — Discharge Summary (Addendum)
Physician Discharge Summary  Rhyan Wolters WUJ:[id number] DOB: 12/29/1970 DOA: 01/20/2017  PCP: Chalmers Cater, MD  Admit date: 01/20/2017 Discharge date: 01/23/2017  Time spent: 35 minutes  Recommendations for Outpatient Follow-up:  1. Repeat BMET to follow up electrolytes and renal function  2. Reassess BP and adjust antihypertensive regimen as needed  3. Assist help with weight loss and if needed referral to obesity clinic. 4. Please reassess resolution of patient symptoms and if needed arrange outpatient follow up with GI service.   Discharge Diagnoses:  Active Problems:   Intractable nausea and vomiting   Obesity, Class III, BMI 40-49.9 (morbid obesity) (HCC)   Gastroesophageal reflux disease   Benign essential HTN   Hypokalemia   Discharge Condition: stable and improved. Discharge home with instructions to follow up with PCP in 10 days.  Diet recommendation: low sodium and low calorie diet   Filed Weights   01/22/17 0614 01/23/17 0459 01/23/17 0500  Weight: 123.3 kg (271 lb 13.2 oz) 121.2 kg (267 lb 3.2 oz) 121.1 kg (266 lb 15.6 oz)    History of present illness:  46 y.o. female with past medical history of hypertension, bipolar and anxiety presents emergency room with continuous nausea vomiting.  Multiple episodes prior to coming to the hospital.  Multiple episodes afterwards.  No diarrhea.  No similar prior episodes.  No sick contacts.  No abdominal trauma. Positive THC on her UDS.  Hospital Course:  1-intractable nausea/vomiting  -most likely multifactorial: due to use of THC (even patient reported one time use only), due to GERD (with presumed esophagitis on CT scan) and /or viral gastroenteritis. -patient hydrated and symptoms controlled -will discharge on PPI and PRN antiemetics  -advise to minimize use of NSAID's and to maintain proper hydration  -multiple small meals daily and to lose weight  -if symptoms failed to improved despite two weeks of treatment will  need outpatient follow up with GI service for EGD.  2-HTN -will discharge on lisinopril and hydralazine  -advise to follow low sodium diet  -PCP to follow BP and adjust antihypertensive regimen as needed   3-hypokalemia -most likely from GI loses -repleted -patient also discharge on lisinopril  -repeat BMET at follow up visit   4-morbid obesity  -Body mass index is 43.09 kg/m. -low calorie diet and exercise discussed with patient   5-bipolar disorder  -will continue home medication regimen  -no SI or hallucinations   Procedures: See below for x-ray reports   Consultations:  None   Discharge Exam: Vitals:   01/23/17 0500 01/23/17 1049  BP: (!) 165/88 (!) 161/93  Pulse: (!) 108   Resp: 18   Temp: 98.1 F (36.7 C)   SpO2: 100%     General: afebrile, no CP, no SOB, no fur there vomiting, no abd pain. Very mild nausea reported, but able to keep food and meds down prior to discharge. Cardiovascular: S1 and S2, no rubs, no gallops, no murmur Respiratory: CTA bilaterally Abd: soft, obese, NT, ND, positive BS Extremities: no edema, no cyanosis, no clubbing   Discharge Instructions   Discharge Instructions    Diet - low sodium heart healthy    Complete by:  As directed    Discharge instructions    Complete by:  As directed    Take medications as prescribed  Keep yourself well hydrated  Follow low sodium diet and low calorie diet  Arrange follow up with PCP in 10 days     Current Discharge Medication List  START taking these medications   Details  hydrALAZINE (APRESOLINE) 25 MG tablet Take 1 tablet (25 mg total) by mouth 3 (three) times daily. Qty: 90 tablet, Refills: 1    ondansetron (ZOFRAN ODT) 8 MG disintegrating tablet Take 1 tablet (8 mg total) by mouth every 8 (eight) hours as needed for nausea or vomiting. Qty: 30 tablet, Refills: 0    pantoprazole (PROTONIX) 40 MG tablet Take 1 tablet (40 mg total) by mouth daily. Qty: 30 tablet, Refills: 1       CONTINUE these medications which have CHANGED   Details  lisinopril (PRINIVIL,ZESTRIL) 5 MG tablet Take 1 tablet (5 mg total) by mouth daily. For high blood pressure Qty: 30 tablet, Refills: 1      CONTINUE these medications which have NOT CHANGED   Details  ALPRAZolam (XANAX) 1 MG tablet Take 1 mg by mouth 2 (two) times daily as needed for anxiety. Refills: 1    citalopram (CELEXA) 20 MG tablet Take 1 tablet (20 mg total) by mouth daily. For depression Qty: 30 tablet, Refills: 0    lurasidone (LATUDA) 40 MG TABS tablet Take 1 tablet (40 mg total) by mouth daily with breakfast. For mood control Qty: 30 tablet, Refills: 0    zolpidem (AMBIEN) 10 MG tablet Take 1 tablet (10 mg total) by mouth at bedtime as needed for sleep. Qty: 7 tablet, Refills: 0    gabapentin (NEURONTIN) 100 MG capsule Take 1 capsule (100 mg total) by mouth 3 (three) times daily. For agitation Qty: 30 capsule, Refills: 0      STOP taking these medications     zaleplon (SONATA) 5 MG capsule        No Known Allergies Follow-up Information    Winter, Christopher Aaron, MD Follow up.   Specialty:  Urology Why:  As soon as possible for follow up on the blood in the urine Contact information: [address] [place] [address] [phone]        Audley Hose, MD. Schedule an appointment as soon as possible for a visit in 10 day(s).   Specialty:  Internal Medicine Contact information: [place] [address] [phone]           The results of significant diagnostics from this hospitalization (including imaging, microbiology, ancillary and laboratory) are listed below for reference.    Significant Diagnostic Studies: US Transvaginal Non-ob  Result Date: 01/20/2017 CLINICAL DATA:  Vomiting for 3 days. Left lower quadrant pain on palpation. Previous hysterectomy 2013 for cervical cancer. Gravida 1 para 1. Negative CT today. LMP 2013. EXAM:  TRANSABDOMINAL AND TRANSVAGINAL ULTRASOUND OF PELVIS DOPPLER ULTRASOUND OF OVARIES TECHNIQUE: Both transabdominal and transvaginal ultrasound examinations of the pelvis were performed. Transabdominal technique was performed for global imaging of the pelvis including uterus, ovaries, adnexal regions, and pelvic cul-de-sac. It was necessary to proceed with endovaginal exam following the transabdominal exam to visualize the ovaries and adnexal regions. Color and duplex Doppler ultrasound was utilized to evaluate blood flow to the ovaries. COMPARISON:  CT of the abdomen and pelvis 01/20/2017 FINDINGS: Uterus Measurements: Surgically absent. Vaginal cuff is unremarkable in appearance. Endometrium Thickness: Uterus is surgically absent. Right ovary Measurements: 3.1 x 2.1 x 1.8 cm. Normal appearance/no adnexal mass. Left ovary Measurements: 4.6 x 2.2 x 1.6 cm. Small follicles are present, largest measuring 1.5 cm. Pulsed Doppler evaluation of both ovaries demonstrates normal low-resistance arterial and venous waveforms. Other findings Small amount of fluid in  the left adnexal region. IMPRESSION: 1. Status post hysterectomy. 2. Normal appearance of both ovaries. 3. No adnexal mass or vaginal cuff abnormality. Electronically Signed   By: Nolon Nations M.D.   On: 01/20/2017 09:35   US Pelvis Complete  Result Date: 01/20/2017 CLINICAL DATA:  Vomiting for 3 days. Left lower quadrant pain on palpation. Previous hysterectomy 2013 for cervical cancer. Gravida 1 para 1. Negative CT today. LMP 2013. EXAM: TRANSABDOMINAL AND TRANSVAGINAL ULTRASOUND OF PELVIS DOPPLER ULTRASOUND OF OVARIES TECHNIQUE: Both transabdominal and transvaginal ultrasound examinations of the pelvis were performed. Transabdominal technique was performed for global imaging of the pelvis including uterus, ovaries, adnexal regions, and pelvic cul-de-sac. It was necessary to proceed with endovaginal exam following the transabdominal exam to visualize the  ovaries and adnexal regions. Color and duplex Doppler ultrasound was utilized to evaluate blood flow to the ovaries. COMPARISON:  CT of the abdomen and pelvis 01/20/2017 FINDINGS: Uterus Measurements: Surgically absent. Vaginal cuff is unremarkable in appearance. Endometrium Thickness: Uterus is surgically absent. Right ovary Measurements: 3.1 x 2.1 x 1.8 cm. Normal appearance/no adnexal mass. Left ovary Measurements: 4.6 x 2.2 x 1.6 cm. Small follicles are present, largest measuring 1.5 cm. Pulsed Doppler evaluation of both ovaries demonstrates normal low-resistance arterial and venous waveforms. Other findings Small amount of fluid in the left adnexal region. IMPRESSION: 1. Status post hysterectomy. 2. Normal appearance of both ovaries. 3. No adnexal mass or vaginal cuff abnormality. Electronically Signed   By: Nolon Nations M.D.   On: 01/20/2017 09:35   Ct Abdomen Pelvis W Contrast  Result Date: 01/20/2017 CLINICAL DATA:  Acute onset of vomiting after eating chicken. EXAM: CT ABDOMEN AND PELVIS WITH CONTRAST TECHNIQUE: Multidetector CT imaging of the abdomen and pelvis was performed using the standard protocol following bolus administration of intravenous contrast. CONTRAST:  176mL ISOVUE-300 IOPAMIDOL (ISOVUE-300) INJECTION 61% COMPARISON:  CT of the abdomen and pelvis from 03/18/2007, and abdominal ultrasound performed 03/17/2007 FINDINGS: Lower chest: Wall thickening at the distal esophagus raises concern for esophagitis. The visualized portions of the mediastinum are unremarkable. The visualized lung bases are clear. Hepatobiliary: The liver is unremarkable in appearance. The gallbladder is unremarkable in appearance. The common bile duct remains normal in caliber. Pancreas: The pancreas is within normal limits. Spleen: The spleen is unremarkable in appearance. Adrenals/Urinary Tract: The adrenal glands are unremarkable in appearance. The kidneys are within normal limits. There is no evidence of  hydronephrosis. No renal or ureteral stones are identified. No perinephric stranding is seen. Set Stomach/Bowel: The stomach is unremarkable in appearance. The small bowel is within normal limits. The appendix is normal in caliber, without evidence of appendicitis. The colon is unremarkable in appearance. Vascular/Lymphatic: The abdominal aorta is unremarkable in appearance. The inferior vena cava is grossly unremarkable. No retroperitoneal lymphadenopathy is seen. No pelvic sidewall lymphadenopathy is identified. Reproductive: The bladder is mildly distended and grossly unremarkable. The patient is status post hysterectomy. The ovaries are relatively symmetric. No suspicious adnexal masses are seen. Other: No additional soft tissue abnormalities are seen. Musculoskeletal: No acute osseous abnormalities are identified. The visualized musculature is unremarkable in appearance. IMPRESSION: 1. Wall thickening along the distal esophagus raises concern for esophagitis. This is similar to its appearance in 2008, and may be chronic in nature, or may reflect a recurrent process. Would correlate with the patient's symptoms. 2. Otherwise unremarkable contrast-enhanced CT of the abdomen and pelvis. Electronically Signed   By: Garald Balding M.D.   On: 01/20/2017 07:01   [place]  Art/ven Flow Abd Pelv Doppler  Result Date: 01/20/2017 CLINICAL DATA:  Vomiting for 3 days. Left lower quadrant pain on palpation. Previous hysterectomy 2013 for cervical cancer. Gravida 1 para 1. Negative CT today. LMP 2013. EXAM: TRANSABDOMINAL AND TRANSVAGINAL ULTRASOUND OF PELVIS DOPPLER ULTRASOUND OF OVARIES TECHNIQUE: Both transabdominal and transvaginal ultrasound examinations of the pelvis were performed. Transabdominal technique was performed for global imaging of the pelvis including uterus, ovaries, adnexal regions, and pelvic cul-de-sac. It was necessary to proceed with endovaginal exam following the transabdominal exam to visualize the  ovaries and adnexal regions. Color and duplex Doppler ultrasound was utilized to evaluate blood flow to the ovaries. COMPARISON:  CT of the abdomen and pelvis 01/20/2017 FINDINGS: Uterus Measurements: Surgically absent. Vaginal cuff is unremarkable in appearance. Endometrium Thickness: Uterus is surgically absent. Right ovary Measurements: 3.1 x 2.1 x 1.8 cm. Normal appearance/no adnexal mass. Left ovary Measurements: 4.6 x 2.2 x 1.6 cm. Small follicles are present, largest measuring 1.5 cm. Pulsed Doppler evaluation of both ovaries demonstrates normal low-resistance arterial and venous waveforms. Other findings Small amount of fluid in the left adnexal region. IMPRESSION: 1. Status post hysterectomy. 2. Normal appearance of both ovaries. 3. No adnexal mass or vaginal cuff abnormality. Electronically Signed   By: Nolon Nations M.D.   On: 01/20/2017 09:35    Microbiology: Recent Results (from the past 240 hour(s))  Wet prep, genital     Status: None   Collection Time: 01/20/17 10:45 AM  Result Value Ref Range Status   Yeast Wet Prep HPF POC NONE SEEN NONE SEEN Final   Trich, Wet Prep NONE SEEN NONE SEEN Final   Clue Cells Wet Prep HPF POC NONE SEEN NONE SEEN Final   WBC, Wet Prep HPF POC NONE SEEN NONE SEEN Final   Sperm NONE SEEN  Final     Labs: Basic Metabolic Panel:  Recent Labs Lab 01/20/17 0333 01/21/17 0452 01/22/17 0446  NA 138 139 134*  K 3.2* 3.1* 3.1*  CL 97* 102 101  CO2 20* 23 19*  GLUCOSE 144* 123* 111*  BUN 15 7 7   CREATININE 0.94 0.65 0.69  CALCIUM 9.0 8.8* 8.8*  MG  --   --  1.7   Liver Function Tests:  Recent Labs Lab 01/20/17 0333  AST 75*  ALT 26  ALKPHOS 86  BILITOT 1.3*  PROT 8.6*  ALBUMIN 4.2    Recent Labs Lab 01/20/17 0333  LIPASE 22   CBC:  Recent Labs Lab 01/20/17 0333 01/21/17 0452 01/22/17 0446  WBC 20.4* 15.4* 17.8*  HGB 12.7 12.5 13.2  HCT 37.4 37.2 39.2  MCV 82.2 83.2 83.1  PLT 308 278 291    Signed:  Barton Dubois  MD.  [facility] 01/23/2017, 3:09 PM

## 2017-01-23 NOTE — Progress Notes (Signed)
Patient is stable for discharge. Discharge instructions and medications have been reviewed with the patient and all questions answered. AVS and prescriptions given to patient.  

## 2017-01-23 NOTE — Plan of Care (Signed)
Problem: Nutrition: Goal: Adequate nutrition will be maintained Outcome: Not Progressing Poor PO intake

## 2017-01-23 NOTE — Progress Notes (Signed)
Pt discharging home with no needs at present time.

## 2017-09-04 ENCOUNTER — Encounter (HOSPITAL_COMMUNITY): Payer: Self-pay | Admitting: Emergency Medicine

## 2017-09-04 DIAGNOSIS — R197 Diarrhea, unspecified: Secondary | ICD-10-CM | POA: Diagnosis not present

## 2017-09-04 DIAGNOSIS — I10 Essential (primary) hypertension: Secondary | ICD-10-CM | POA: Diagnosis not present

## 2017-09-04 DIAGNOSIS — Z8541 Personal history of malignant neoplasm of cervix uteri: Secondary | ICD-10-CM | POA: Diagnosis not present

## 2017-09-04 DIAGNOSIS — R112 Nausea with vomiting, unspecified: Secondary | ICD-10-CM | POA: Diagnosis not present

## 2017-09-04 DIAGNOSIS — R109 Unspecified abdominal pain: Secondary | ICD-10-CM | POA: Diagnosis present

## 2017-09-04 DIAGNOSIS — Z79899 Other long term (current) drug therapy: Secondary | ICD-10-CM | POA: Diagnosis not present

## 2017-09-04 LAB — CBC
HEMATOCRIT: 38.1 % (ref 36.0–46.0)
HEMOGLOBIN: 13.1 g/dL (ref 12.0–15.0)
MCH: 30.3 pg (ref 26.0–34.0)
MCHC: 34.4 g/dL (ref 30.0–36.0)
MCV: 88.2 fL (ref 78.0–100.0)
PLATELETS: 301 10*3/uL (ref 150–400)
RBC: 4.32 MIL/uL (ref 3.87–5.11)
RDW: 17.5 % — ABNORMAL HIGH (ref 11.5–15.5)
WBC: 17.4 10*3/uL — AB (ref 4.0–10.5)

## 2017-09-04 LAB — COMPREHENSIVE METABOLIC PANEL
ALT: 22 U/L (ref 14–54)
ANION GAP: 12 (ref 5–15)
AST: 30 U/L (ref 15–41)
Albumin: 3.6 g/dL (ref 3.5–5.0)
Alkaline Phosphatase: 56 U/L (ref 38–126)
BUN: 7 mg/dL (ref 6–20)
CHLORIDE: 103 mmol/L (ref 101–111)
CO2: 20 mmol/L — AB (ref 22–32)
Calcium: 8.4 mg/dL — ABNORMAL LOW (ref 8.9–10.3)
Creatinine, Ser: 0.66 mg/dL (ref 0.44–1.00)
Glucose, Bld: 174 mg/dL — ABNORMAL HIGH (ref 65–99)
POTASSIUM: 2.8 mmol/L — AB (ref 3.5–5.1)
SODIUM: 135 mmol/L (ref 135–145)
Total Bilirubin: 0.9 mg/dL (ref 0.3–1.2)
Total Protein: 6.9 g/dL (ref 6.5–8.1)

## 2017-09-04 LAB — I-STAT BETA HCG BLOOD, ED (MC, WL, AP ONLY): I-stat hCG, quantitative: 10.5 m[IU]/mL — ABNORMAL HIGH (ref <5)

## 2017-09-04 LAB — LIPASE, BLOOD: LIPASE: 19 U/L (ref 11–51)

## 2017-09-04 MED ORDER — ONDANSETRON 4 MG PO TBDP
4.0000 mg | ORAL_TABLET | Freq: Once | ORAL | Status: AC | PRN
  Administered 2017-09-04: 4 mg via ORAL
  Filled 2017-09-04: qty 1

## 2017-09-04 NOTE — ED Triage Notes (Signed)
Patient c/o abdominal cramping with N/V/Dsince approximately 1630 today. Denies chest pain and SOB.

## 2017-09-05 ENCOUNTER — Emergency Department (HOSPITAL_COMMUNITY)
Admission: EM | Admit: 2017-09-05 | Discharge: 2017-09-05 | Disposition: A | Discharge status: Home / Self Care | Payer: BLUE CROSS/BLUE SHIELD | Attending: Emergency Medicine | Admitting: Emergency Medicine

## 2017-09-05 DIAGNOSIS — R112 Nausea with vomiting, unspecified: Secondary | ICD-10-CM

## 2017-09-05 DIAGNOSIS — R197 Diarrhea, unspecified: Secondary | ICD-10-CM

## 2017-09-05 MED ORDER — SODIUM CHLORIDE 0.9 % IV BOLUS
1000.0000 mL | Freq: Once | INTRAVENOUS | Status: AC
  Administered 2017-09-05: 1000 mL via INTRAVENOUS

## 2017-09-05 MED ORDER — DICYCLOMINE HCL 20 MG PO TABS: 20.0000 mg | ORAL_TABLET | Freq: Two times a day (BID) | ORAL | 0 refills | Status: DC

## 2017-09-05 MED ORDER — ONDANSETRON HCL 4 MG/2ML IJ SOLN
INTRAMUSCULAR | Status: AC
  Filled 2017-09-05: qty 2

## 2017-09-05 MED ORDER — POTASSIUM CHLORIDE 10 MEQ/100ML IV SOLN
INTRAVENOUS | Status: AC
  Filled 2017-09-05: qty 100

## 2017-09-05 MED ORDER — KETOROLAC TROMETHAMINE 30 MG/ML IJ SOLN
INTRAMUSCULAR | Status: AC
  Filled 2017-09-05: qty 1

## 2017-09-05 MED ORDER — POTASSIUM CHLORIDE 10 MEQ/100ML IV SOLN
10.0000 meq | Freq: Once | INTRAVENOUS | Status: AC
  Administered 2017-09-05: 10 meq via INTRAVENOUS

## 2017-09-05 MED ORDER — ONDANSETRON 4 MG PO TBDP: 4.0000 mg | ORAL_TABLET | Freq: Three times a day (TID) | ORAL | 0 refills | Status: DC | PRN

## 2017-09-05 MED ORDER — ONDANSETRON HCL 4 MG/2ML IJ SOLN
4.0000 mg | Freq: Once | INTRAMUSCULAR | Status: AC
  Administered 2017-09-05: 4 mg via INTRAVENOUS

## 2017-09-05 MED ORDER — KETOROLAC TROMETHAMINE 30 MG/ML IJ SOLN
30.0000 mg | Freq: Once | INTRAMUSCULAR | Status: AC
  Administered 2017-09-05: 30 mg via INTRAVENOUS

## 2017-09-05 MED ORDER — POTASSIUM CHLORIDE CRYS ER 20 MEQ PO TBCR
40.0000 meq | EXTENDED_RELEASE_TABLET | Freq: Once | ORAL | Status: AC
  Administered 2017-09-05: 40 meq via ORAL
  Filled 2017-09-05: qty 2

## 2017-09-05 NOTE — ED Provider Notes (Signed)
[facility] DEPT Provider Note   CSN: [id number] Arrival date & time: 09/04/17  2124     History   Chief Complaint Chief Complaint  Patient presents with  . Abdominal Pain    HPI Shelby Howard is a 47 y.o. female.  The history is provided by the patient and medical records.     47 year old female with history of anemia, anxiety, bipolar disorder, depression, uterine fibroids, hypertension, insomnia, presenting to the ED for abdominal pain, nausea, vomiting, diarrhea.  Reports symptoms began abruptly this morning 0430.  States throughout the day she has had multiple episodes of nonbloody, nonbilious emesis as well as nonbloody, watery diarrhea.  States while she was at work today she was able to drink some sips of water but upon returning home she has not been able to hold anything down including her medications.  She denies any fever or chills.  Reports some generalized abdominal cramping and sensation of "stomach upset".  Prior abdominal surgeries include hysterectomy.  Patient reports she had similar symptoms last October.  She did recently travel to [facility] and back, occasionally has issues with travel.  No abnormal food intake while out there.  Past Medical History:  Diagnosis Date  . Anemia    history  . Anxiety   . Bipolar disorder ([place])   . Depression   . 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 Fibroids   . Hypertension   . Insomnia     Patient Active Problem List   Diagnosis Date Noted  . Obesity, Class III, BMI 40-49.9 (morbid obesity) ([place])   . Gastroesophageal reflux disease   . Benign essential HTN   . Hypokalemia   . Intractable nausea and vomiting 01/20/2017  . Alcohol use disorder, mild, abuse 11/12/2015  . Bipolar I disorder, most recent episode depressed ([place]) 11/12/2015  . Fibroids   . Bipolar disorder ([place])   . Anxiety   . Depression   . Insomnia   . 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 CERVICAL CONIZATION W/BX  03/02/2011   Procedure: CONIZATION CERVIX WITH BIOPSY;  Surgeon: Betsy Coder, MD;  Location: [facility];  Service: Gynecology;  Laterality: N/A;  Cold Knife [place], Endocervical Curretage  . CYSTOSCOPY  09/06/2011   Procedure: CYSTOSCOPY;  Surgeon: Betsy Coder, MD;  Location: [facility];  Service: Gynecology;  Laterality: N/A;  . DILATION AND CURETTAGE OF UTERUS  09/2007   resection of polyp  . LAPAROSCOPIC HYSTERECTOMY  09/06/2011   Procedure: HYSTERECTOMY TOTAL LAPAROSCOPIC;  Surgeon: Betsy Coder, MD;  Location: [facility];  Service: Gynecology;  Laterality: N/A;  . SVD  1998   x 1     OB History    Gravida  1   Para  1   Term      Preterm      AB      Living  1     SAB      TAB      Ectopic      Multiple      Live Births               Home Medications    Prior to Admission medications   Medication Sig Start Date End Date Taking? Authorizing Provider  ALPRAZolam Duanne Moron) 1 MG tablet Take 1 mg by mouth 2 (two) times daily as needed for anxiety. 05/30/16  [provider]  citalopram (CELEXA) 20 MG tablet Take 1 tablet (20 mg total) by mouth daily. For depression Patient taking differently: Take 20 mg by mouth at bedtime. For depression 11/15/15   Lindell Spar I, NP  gabapentin (NEURONTIN) 100 MG capsule Take 1 capsule (100 mg total) by mouth 3 (three) times daily. For agitation Patient not taking: Reported on 06/12/2016 11/15/15   Lindell Spar I, NP  hydrALAZINE (APRESOLINE) 25 MG tablet Take 1 tablet (25 mg total) by mouth 3 (three) times daily. 01/23/17 01/23/18  Barton Dubois, MD  lisinopril (PRINIVIL,ZESTRIL) 5 MG tablet Take 1 tablet (5 mg total) by mouth daily. For high blood pressure 01/23/17   Barton Dubois, MD  lurasidone (LATUDA) 40 MG TABS tablet Take 1 tablet (40 mg total) by mouth daily with breakfast. For mood control Patient taking  differently: Take 40 mg by mouth at bedtime. For mood control 11/15/15   Lindell Spar I, NP  ondansetron (ZOFRAN ODT) 8 MG disintegrating tablet Take 1 tablet (8 mg total) by mouth every 8 (eight) hours as needed for nausea or vomiting. 01/23/17   Barton Dubois, MD  pantoprazole (PROTONIX) 40 MG tablet Take 1 tablet (40 mg total) by mouth daily. 01/23/17 01/23/18  Barton Dubois, MD  zolpidem (AMBIEN) 10 MG tablet Take 1 tablet (10 mg total) by mouth at bedtime as needed for sleep. 11/15/15   Encarnacion Slates, NP    Family History Family History  Problem Relation Age of Onset  . Hypertension Mother   . Bipolar disorder Mother   . Bipolar disorder Sister     Social History Social History   Tobacco Use  . Smoking status: Never Smoker  . Smokeless tobacco: Never Used  Substance Use Topics  . Alcohol use: Yes    Alcohol/week: 2.4 oz    Types: 4 Glasses of wine per week    Comment: weekly -wine  . Drug use: No     Allergies   Patient has no known allergies.   Review of Systems Review of Systems  Gastrointestinal: Positive for abdominal pain, diarrhea, nausea and vomiting.  All other systems reviewed and are negative.    Physical Exam Updated Vital Signs BP (!) 150/105 (BP Location: Left Arm)   Pulse 86   Temp 97.8 F (36.6 C) (Oral)   Resp 20   LMP 08/07/2011   SpO2 99%   Physical Exam  Constitutional: She is oriented to person, place, and time. She appears well-developed and well-nourished.  HENT:  Head: Normocephalic and atraumatic.  Mouth/Throat: Oropharynx is clear and moist.  Mildly dry mucous membranes  Eyes: Pupils are equal, round, and reactive to light. Conjunctivae and EOM are normal.  Neck: Normal range of motion.  Cardiovascular: Normal rate, regular rhythm and normal heart sounds.  Pulmonary/Chest: Effort normal and breath sounds normal.  Abdominal: Soft. Bowel sounds are normal. There is no tenderness. There is no rigidity and no guarding.  No focal  tenderness, no peritoneal signs  Musculoskeletal: Normal range of motion.  Neurological: She is alert and oriented to person, place, and time.  Skin: Skin is warm and dry.  Psychiatric: She has a normal mood and affect.  Nursing note and vitals reviewed.    ED Treatments / Results  Labs (all labs ordered are listed, but only abnormal results are displayed) Labs Reviewed  COMPREHENSIVE METABOLIC PANEL - Abnormal; Notable for the following components:      Result Value   Potassium 2.8 (*)  CO2 20 (*)    Glucose, Bld 174 (*)    Calcium 8.4 (*)    All other components within normal limits  CBC - Abnormal; Notable for the following components:   WBC 17.4 (*)    RDW 17.5 (*)    All other components within normal limits  I-STAT BETA HCG BLOOD, ED ([facility], [facility], [facility] ONLY) - Abnormal; Notable for the following components:   I-stat hCG, quantitative 10.5 (*)    All other components within normal limits  LIPASE, BLOOD  URINALYSIS, ROUTINE W REFLEX MICROSCOPIC    EKG None  Radiology No results found.  Procedures Procedures (including critical care time)  Medications Ordered in ED Medications  ondansetron (ZOFRAN-ODT) disintegrating tablet 4 mg (4 mg Oral Given 09/04/17 2223)  sodium chloride 0.9 % bolus 1,000 mL (0 mLs Intravenous Stopped 09/05/17 0433)  ondansetron (ZOFRAN) injection 4 mg (4 mg Intravenous Given 09/05/17 0316)  ketorolac (TORADOL) 30 MG/ML injection 30 mg (30 mg Intravenous Given 09/05/17 0316)  potassium chloride 10 mEq in 100 mL IVPB (0 mEq Intravenous Stopped 09/05/17 0433)  potassium chloride SA (K-DUR,KLOR-CON) CR tablet 40 mEq (40 mEq Oral Given 09/05/17 0500)     Initial Impression / Assessment and Plan / ED Course  I have reviewed the triage vital signs and the nursing notes.  Pertinent labs & imaging results that were available during my care of the patient were reviewed by me and considered in my medical decision making (see chart for  details).  47 year old female here with abdominal pain, nausea, vomiting, and diarrhea of sudden onset yesterday morning.  Here she is afebrile and nontoxic in appearance.  Abdomen is soft and benign.  Mucous membranes are mildly dry.  Screening labs obtained from triage, has leukocytosis at 17.4, may be reactive from vomiting.  Her potassium is also mildly low at 2.8 which I suspect is from her vomiting.  I-STAT hCG is elevated at 10.5.  This is felt to be a false positive as patient is status post hysterectomy.  Will give IVF, zofran, toradol, replace K+ IV and PO.  Will reassess.  Patient feeling better after IVF and medications here.  No emesis here in the ED.  Was able to tolerate oral potassium.  VSS.  Suspect viral etiology, possibly from recent travel.  Will have her continue symptomatic care at home, push oral fluids.  Close follow-up with PCP.  Discussed plan with patient, she acknowledged understanding and agreed with plan of care.  Return precautions given for new or worsening symptoms.  Final Clinical Impressions(s) / ED Diagnoses   Final diagnoses:  Nausea vomiting and diarrhea    ED Discharge Orders        Ordered    ondansetron (ZOFRAN ODT) 4 MG disintegrating tablet  Every 8 hours PRN     09/05/17 0526    dicyclomine (BENTYL) 20 MG tablet  2 times daily     09/05/17 0526       Larene Pickett, PA-C 03/00/92 3300    Delora Fuel, MD 76/22/63 [phone]

## 2017-09-05 NOTE — Discharge Instructions (Signed)
Take the prescribed medication as directed.  Make sure to push oral fluids.  Can start with bland diet and progress back to normal as tolerated. Follow-up with your primary care doctor. Return to the ED for new or worsening symptoms.

## 2017-10-02 ENCOUNTER — Other Ambulatory Visit: Payer: Self-pay

## 2017-10-02 ENCOUNTER — Encounter (HOSPITAL_COMMUNITY): Payer: Self-pay | Admitting: *Deleted

## 2017-10-02 ENCOUNTER — Emergency Department (HOSPITAL_COMMUNITY): Payer: BLUE CROSS/BLUE SHIELD

## 2017-10-02 ENCOUNTER — Emergency Department (HOSPITAL_COMMUNITY)
Admission: EM | Admit: 2017-10-02 | Discharge: 2017-10-02 | Disposition: A | Discharge status: Home / Self Care | Payer: BLUE CROSS/BLUE SHIELD | Attending: Emergency Medicine | Admitting: Emergency Medicine

## 2017-10-02 DIAGNOSIS — Z79899 Other long term (current) drug therapy: Secondary | ICD-10-CM | POA: Insufficient documentation

## 2017-10-02 DIAGNOSIS — R109 Unspecified abdominal pain: Secondary | ICD-10-CM | POA: Diagnosis present

## 2017-10-02 DIAGNOSIS — K529 Noninfective gastroenteritis and colitis, unspecified: Secondary | ICD-10-CM | POA: Diagnosis not present

## 2017-10-02 DIAGNOSIS — I10 Essential (primary) hypertension: Secondary | ICD-10-CM | POA: Diagnosis not present

## 2017-10-02 LAB — URINALYSIS, ROUTINE W REFLEX MICROSCOPIC
BILIRUBIN URINE: NEGATIVE
GLUCOSE, UA: NEGATIVE mg/dL
KETONES UR: 5 mg/dL — AB
LEUKOCYTES UA: NEGATIVE
NITRITE: NEGATIVE
PH: 5 (ref 5.0–8.0)
Protein, ur: NEGATIVE mg/dL
SPECIFIC GRAVITY, URINE: 1.018 (ref 1.005–1.030)

## 2017-10-02 LAB — CBC
HCT: 41.3 % (ref 36.0–46.0)
Hemoglobin: 14.4 g/dL (ref 12.0–15.0)
MCH: 30.4 pg (ref 26.0–34.0)
MCHC: 34.9 g/dL (ref 30.0–36.0)
MCV: 87.1 fL (ref 78.0–100.0)
PLATELETS: 327 10*3/uL (ref 150–400)
RBC: 4.74 MIL/uL (ref 3.87–5.11)
RDW: 16.5 % — AB (ref 11.5–15.5)
WBC: 20.5 10*3/uL — AB (ref 4.0–10.5)

## 2017-10-02 LAB — COMPREHENSIVE METABOLIC PANEL
ALT: 25 U/L (ref 0–44)
AST: 24 U/L (ref 15–41)
Albumin: 4.1 g/dL (ref 3.5–5.0)
Alkaline Phosphatase: 72 U/L (ref 38–126)
Anion gap: 15 (ref 5–15)
BILIRUBIN TOTAL: 0.6 mg/dL (ref 0.3–1.2)
BUN: 9 mg/dL (ref 6–20)
CO2: 20 mmol/L — ABNORMAL LOW (ref 22–32)
CREATININE: 0.69 mg/dL (ref 0.44–1.00)
Calcium: 9 mg/dL (ref 8.9–10.3)
Chloride: 100 mmol/L (ref 98–111)
Glucose, Bld: 181 mg/dL — ABNORMAL HIGH (ref 70–99)
Potassium: 3.3 mmol/L — ABNORMAL LOW (ref 3.5–5.1)
Sodium: 135 mmol/L (ref 135–145)
TOTAL PROTEIN: 7.6 g/dL (ref 6.5–8.1)

## 2017-10-02 LAB — PREGNANCY, URINE: Preg Test, Ur: NEGATIVE

## 2017-10-02 LAB — LIPASE, BLOOD: LIPASE: 25 U/L (ref 11–51)

## 2017-10-02 MED ORDER — ONDANSETRON HCL 4 MG/2ML IJ SOLN
4.0000 mg | Freq: Once | INTRAMUSCULAR | Status: AC
  Administered 2017-10-02: 4 mg via INTRAVENOUS
  Filled 2017-10-02: qty 2

## 2017-10-02 MED ORDER — HYDROCODONE-ACETAMINOPHEN 5-325 MG PO TABS: 1.0000 | ORAL_TABLET | Freq: Four times a day (QID) | ORAL | 0 refills | Status: AC | PRN

## 2017-10-02 MED ORDER — SODIUM CHLORIDE 0.9 % IV BOLUS
1000.0000 mL | Freq: Once | INTRAVENOUS | Status: AC
  Administered 2017-10-02: 1000 mL via INTRAVENOUS

## 2017-10-02 MED ORDER — MORPHINE SULFATE (PF) 4 MG/ML IV SOLN
4.0000 mg | Freq: Once | INTRAVENOUS | Status: AC
  Administered 2017-10-02: 4 mg via INTRAVENOUS
  Filled 2017-10-02: qty 1

## 2017-10-02 MED ORDER — ONDANSETRON HCL 4 MG PO TABS: 4.0000 mg | ORAL_TABLET | Freq: Four times a day (QID) | ORAL | 0 refills | Status: DC

## 2017-10-02 MED ORDER — CIPROFLOXACIN HCL 500 MG PO TABS: 500.0000 mg | ORAL_TABLET | Freq: Two times a day (BID) | ORAL | 0 refills | Status: DC

## 2017-10-02 MED ORDER — DICYCLOMINE HCL 20 MG PO TABS
20.0000 mg | ORAL_TABLET | Freq: Two times a day (BID) | ORAL | 0 refills | Status: DC
Start: 2017-10-02 — End: 2018-05-24

## 2017-10-02 MED ORDER — IOPAMIDOL (ISOVUE-300) INJECTION 61%
INTRAVENOUS | Status: AC
  Administered 2017-10-02: 100 mL via INTRAVENOUS
  Filled 2017-10-02: qty 100

## 2017-10-02 MED ORDER — METRONIDAZOLE 500 MG PO TABS: 500.0000 mg | ORAL_TABLET | Freq: Three times a day (TID) | ORAL | 0 refills | Status: DC

## 2017-10-02 MED ORDER — IOPAMIDOL (ISOVUE-300) INJECTION 61%
100.0000 mL | Freq: Once | INTRAVENOUS | Status: AC | PRN
  Administered 2017-10-02: 100 mL via INTRAVENOUS

## 2017-10-02 NOTE — ED Notes (Signed)
Pt. Made aware for the need of urine specimen. 

## 2017-10-02 NOTE — ED Triage Notes (Signed)
N/V/D and midline abdominal pain since 2000. Has taken Zofran and Protonix without relief.

## 2017-10-02 NOTE — Discharge Instructions (Signed)
Take medications as prescribed, as needed for your pain and nausea.  Take Cipro and Flagyl until completed.  Do not drink alcohol within 24 hours of taking Flagyl, as it can make you feel very sick.  Please follow-up with a gastroenterologist for further evaluation and treatment of your symptoms.  The enteritis seen on your CT scan was infectious versus Crohn's disease.  You will need to see gastroenterologist for rule out of Crohn's disease.  Please return the emergency department if you develop any new or worsening symptoms including severe, worsening localized abdominal pain not responding to medicine, intractable vomiting, fever over 100.4, or any other new or concerning symptom.  Begin with a clear liquid diet and progress your diet as tolerated.  Avoid greasy, fatty, fried, spicy foods.

## 2017-10-02 NOTE — ED Notes (Signed)
ED Provider at bedside. 

## 2017-10-02 NOTE — ED Notes (Signed)
Patient transported to CT 

## 2017-10-02 NOTE — ED Notes (Signed)
Pt is attempting to provide stool sample now.

## 2017-10-02 NOTE — ED Notes (Signed)
Pt was unable to provide stool sample.

## 2017-10-02 NOTE — ED Provider Notes (Signed)
[facility] DEPT Provider Note   CSN: [id number] Arrival date & time: 10/02/17  0241     History   Chief Complaint Chief Complaint  Patient presents with  . Abdominal Pain    HPI Shelby Howard is a 47 y.o. female with history of hypertension, bipolar disorder, endocervical adenocarcinoma, fibroids, obesity who presents with abdominal pain, nausea, vomiting, diarrhea since 2000 last evening.  Her pain has been constant.  She has associated cramping.  Her abdominal pain is in the middle.  She has had symptoms in the past that are similar.  She has not been seen by gastroenterologist.  She denies any new foods or recent travel.  She denies any bloody stools, abnormal vaginal bleeding or discharge, urinary symptoms, fevers.  HPI  Past Medical History:  Diagnosis Date  . Anemia    history  . Anxiety   . Bipolar disorder ([place])   . Depression   . Endocervical adenocarcinoma ([place])   . Fibroids   . Hypertension   . Insomnia     Patient Active Problem List   Diagnosis Date Noted  . Obesity, Class III, BMI 40-49.9 (morbid obesity) ([name])   . Gastroesophageal reflux disease   . Benign essential HTN   . Hypokalemia   . Intractable nausea and vomiting 01/20/2017  . Alcohol use disorder, mild, abuse 11/12/2015  . Bipolar I disorder, most recent episode depressed ([name]) 11/12/2015  . Fibroids   . Bipolar disorder ([name])   . Anxiety   . Depression   . Insomnia   . Endocervical adenocarcinoma ([name])   . 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 CERVICAL CONIZATION W/BX  03/02/2011   Procedure: CONIZATION CERVIX WITH BIOPSY;  Surgeon: Betsy Coder, MD;  Location: [facility];  Service: Gynecology;  Laterality: N/A;  Cold Knife Conization, Endocervical Curretage  . CYSTOSCOPY  09/06/2011   Procedure: CYSTOSCOPY;  Surgeon: Betsy Coder, MD;  Location: [facility];  Service: Gynecology;  Laterality:  N/A;  . DILATION AND CURETTAGE OF UTERUS  09/2007   resection of polyp  . LAPAROSCOPIC HYSTERECTOMY  09/06/2011   Procedure: HYSTERECTOMY TOTAL LAPAROSCOPIC;  Surgeon: Betsy Coder, MD;  Location: [facility];  Service: Gynecology;  Laterality: N/A;  . SVD  1998   x 1     OB History    Gravida  1   Para  1   Term      Preterm      AB      Living  1     SAB      TAB      Ectopic      Multiple      Live Births               Home Medications    Prior to Admission medications   Medication Sig Start Date End Date Taking? Authorizing Provider  ALPRAZolam Duanne Moron) 1 MG tablet Take 1 mg by mouth 2 (two) times daily as needed for anxiety. 05/30/16  Yes [provider]  citalopram (CELEXA) 20 MG tablet Take 1 tablet (20 mg total) by mouth daily. For depression Patient taking differently: Take 20 mg by mouth at bedtime. For depression 11/15/15  Yes Lindell Spar I, NP  hydrALAZINE (APRESOLINE) 25 MG tablet Take 1 tablet (25 mg total) by mouth 3 (three) times daily. 01/23/17 01/23/18 Yes Barton Dubois, MD  hydrochlorothiazide (HYDRODIURIL) 25 MG tablet Take  25 mg by mouth daily.   Yes [provider]  lisinopril (PRINIVIL,ZESTRIL) 5 MG tablet Take 1 tablet (5 mg total) by mouth daily. For high blood pressure 01/23/17  Yes Barton Dubois, MD  lurasidone (LATUDA) 40 MG TABS tablet Take 1 tablet (40 mg total) by mouth daily with breakfast. For mood control Patient taking differently: Take 40 mg by mouth at bedtime. For mood control 11/15/15  Yes Nwoko, Herbert Pun I, NP  ondansetron (ZOFRAN ODT) 4 MG disintegrating tablet Take 1 tablet (4 mg total) by mouth every 8 (eight) hours as needed for nausea. 09/05/17  Yes Larene Pickett, PA-C  potassium chloride (K-DUR,KLOR-CON) 10 MEQ tablet Take 10 mEq by mouth daily.   Yes [provider]  zaleplon (SONATA) 5 MG capsule 5 mg at bedtime as needed for sleep.  08/20/17  Yes [provider]  zolpidem (AMBIEN) 10 MG  tablet Take 1 tablet (10 mg total) by mouth at bedtime as needed for sleep. 11/15/15  Yes Nwoko, Herbert Pun I, NP  ciprofloxacin (CIPRO) 500 MG tablet Take 1 tablet (500 mg total) by mouth 2 (two) times daily. 10/02/17   Luciann Gossett, Bea Graff, PA-C  dicyclomine (BENTYL) 20 MG tablet Take 1 tablet (20 mg total) by mouth 2 (two) times daily. 10/02/17   Cagney Degrace, Bea Graff, PA-C  gabapentin (NEURONTIN) 100 MG capsule Take 1 capsule (100 mg total) by mouth 3 (three) times daily. For agitation Patient not taking: Reported on 06/12/2016 11/15/15   Lindell Spar I, NP  HYDROcodone-acetaminophen (NORCO/VICODIN) 5-325 MG tablet Take 1-2 tablets by mouth every 6 (six) hours as needed. 10/02/17   Braydon Kullman, Bea Graff, PA-C  metroNIDAZOLE (FLAGYL) 500 MG tablet Take 1 tablet (500 mg total) by mouth 3 (three) times daily. 10/02/17   Dietrick Barris, Bea Graff, PA-C  ondansetron (ZOFRAN) 4 MG tablet Take 1 tablet (4 mg total) by mouth every 6 (six) hours. 10/02/17   Warda Mcqueary, Bea Graff, PA-C  pantoprazole (PROTONIX) 40 MG tablet Take 1 tablet (40 mg total) by mouth daily. Patient not taking: Reported on 09/05/2017 01/23/17 01/23/18  Barton Dubois, MD    Family History Family History  Problem Relation Age of Onset  . Hypertension Mother   . Bipolar disorder Mother   . Bipolar disorder Sister     Social History Social History   Tobacco Use  . Smoking status: Never Smoker  . Smokeless tobacco: Never Used  Substance Use Topics  . Alcohol use: Yes    Alcohol/week: 2.4 oz    Types: 4 Glasses of wine per week    Comment: weekly -wine  . Drug use: No     Allergies   Patient has no known allergies.   Review of Systems Review of Systems  Constitutional: Negative for chills and fever.  HENT: Negative for facial swelling and sore throat.   Respiratory: Negative for shortness of breath.   Cardiovascular: Negative for chest pain.  Gastrointestinal: Positive for abdominal pain, diarrhea, nausea and vomiting. Negative for blood in stool.    Genitourinary: Negative for dysuria.  Musculoskeletal: Negative for back pain.  Skin: Negative for rash and wound.  Neurological: Negative for headaches.  Psychiatric/Behavioral: The patient is not nervous/anxious.      Physical Exam Updated Vital Signs BP (!) 145/97   Pulse 88   Temp 98.3 F (36.8 C) (Oral)   Resp 18   LMP 08/07/2011   SpO2 100%   Physical Exam  Constitutional: She appears well-developed and well-nourished. No distress.  HENT:  Head: Normocephalic and atraumatic.  Mouth/Throat: Oropharynx is clear and moist. No oropharyngeal exudate.  Eyes: Pupils are equal, round, and reactive to light. Conjunctivae are normal. Right eye exhibits no discharge. Left eye exhibits no discharge. No scleral icterus.  Neck: Normal range of motion. Neck supple. No thyromegaly present.  Cardiovascular: Normal rate, regular rhythm, normal heart sounds and intact distal pulses. Exam reveals no gallop and no friction rub.  No murmur heard. Pulmonary/Chest: Effort normal and breath sounds normal. No stridor. No respiratory distress. She has no wheezes. She has no rales.  Abdominal: Soft. Bowel sounds are normal. She exhibits no distension. There is tenderness in the periumbilical area. There is no rebound, no guarding, no tenderness at McBurney's point and negative Murphy's sign.    Musculoskeletal: She exhibits no edema.  Lymphadenopathy:    She has no cervical adenopathy.  Neurological: She is alert. Coordination normal.  Skin: Skin is warm and dry. No rash noted. She is not diaphoretic. No pallor.  Psychiatric: She has a normal mood and affect.  Nursing note and vitals reviewed.    ED Treatments / Results  Labs (all labs ordered are listed, but only abnormal results are displayed) Labs Reviewed  COMPREHENSIVE METABOLIC PANEL - Abnormal; Notable for the following components:      Result Value   Potassium 3.3 (*)    CO2 20 (*)    Glucose, Bld 181 (*)    All other components  within normal limits  CBC - Abnormal; Notable for the following components:   WBC 20.5 (*)    RDW 16.5 (*)    All other components within normal limits  URINALYSIS, ROUTINE W REFLEX MICROSCOPIC - Abnormal; Notable for the following components:   Hgb urine dipstick SMALL (*)    Ketones, ur 5 (*)    Bacteria, UA RARE (*)    All other components within normal limits  GASTROINTESTINAL PANEL BY PCR, STOOL (REPLACES STOOL CULTURE)  LIPASE, BLOOD  PREGNANCY, URINE    EKG None  Radiology Ct Abdomen Pelvis W Contrast  Result Date: 10/02/2017 CLINICAL DATA:  Acute presentation with abdominal pain and elevated white count. EXAM: CT ABDOMEN AND PELVIS WITH CONTRAST TECHNIQUE: Multidetector CT imaging of the abdomen and pelvis was performed using the standard protocol following bolus administration of intravenous contrast. CONTRAST:  181mL ISOVUE-300 IOPAMIDOL (ISOVUE-300) INJECTION 61% COMPARISON:  CT 01/20/2017 and 03/18/2007. FINDINGS: Lower chest: Normal Hepatobiliary: Normal Pancreas: Normal Spleen: Normal appearance of the spleen itself. Surrounding ascites. Adrenals/Urinary Tract: Adrenal glands are normal. Kidneys are normal. Bladder is normal. Stomach/Bowel: Abnormal appearance of the small intestine with wall thickening of multiple loops of the distal ileum. Terminal ileum itself does not appear definitely abnormal. Edema of the associated mesentery. The differential diagnosis is that of infectious enteritis versus is Crohn's disease. The appearance is very similar to the scan of 2008. Vascular/Lymphatic: Normal Reproductive: Previous hysterectomy.  No evidence of pelvic mass. Other: Ascites as noted above, prominently collected adjacent to the spleen but also free within the peritoneal space including the pelvis. No evidence of peritoneal nodules. Musculoskeletal: Normal IMPRESSION: Acute small bowel enteritis, either infectious enteritis or Crohn's disease. Associated ascites. No sign of bowel  obstruction. The appearance is very similar to the examination of 2008. Electronically Signed   By: Nelson Chimes M.D.   On: 10/02/2017 09:29    Procedures Procedures (including critical care time)  Medications Ordered in ED Medications  sodium chloride 0.9 % bolus 1,000 mL (0 mLs Intravenous Stopped  10/02/17 0848)  ondansetron (ZOFRAN) injection 4 mg (4 mg Intravenous Given 10/02/17 0640)  morphine 4 MG/ML injection 4 mg (4 mg Intravenous Given 10/02/17 0641)  iopamidol (ISOVUE-300) 61 % injection 100 mL (100 mLs Intravenous Contrast Given 10/02/17 0900)     Initial Impression / Assessment and Plan / ED Course  I have reviewed the triage vital signs and the nursing notes.  Pertinent labs & imaging results that were available during my care of the patient were reviewed by me and considered in my medical decision making (see chart for details).  Clinical Course as of Oct 03 1506  Wed Oct 02, 2017  0817 On reassessment after morphine and Zofran, patient is feeling improved.  On repeat abdominal exam, patient still having tenderness in areas indicated on physical exam.  We will proceed with CT abdomen pelvis considering significant leukocytosis and bounce back.   [AL]    Clinical Course User Index [AL] Frederica Kuster, PA-C    Patient presenting with abdominal pain, nausea, vomiting, and diarrhea.  CBC shows leukocytosis of 20.5.  CMP shows potassium 3.3, glucose 181.  Lipase 25.  CT abdomen pelvis shows acute small bowel enteritis, either infectious enteritis or Crohn's disease with associated ascites, no sign of bowel obstruction, this is similar to examination in 2008.  Patient with recurrent episodes.  She has never seen GI.  Will refer to GI, but considering possibility of infectious origin, will treat with Cipro and Flagyl.  Patient unable to provide stool sample in the ED.  Will discharge home with symptomatic treatment in addition to Cipro and Flagyl.  I reviewed the [place] narcotic  database and found no discrepancies.  Return precautions discussed.  Patient understands and agrees with plan.  Patient vitals stable throughout ED course and discharged in satisfactory condition.  Final Clinical Impressions(s) / ED Diagnoses   Final diagnoses:  Enteritis    ED Discharge Orders        Ordered    ciprofloxacin (CIPRO) 500 MG tablet  2 times daily     10/02/17 1125    metroNIDAZOLE (FLAGYL) 500 MG tablet  3 times daily     10/02/17 1125    HYDROcodone-acetaminophen (NORCO/VICODIN) 5-325 MG tablet  Every 6 hours PRN     10/02/17 1125    ondansetron (ZOFRAN) 4 MG tablet  Every 6 hours     10/02/17 1125    dicyclomine (BENTYL) 20 MG tablet  2 times daily     10/02/17 [place], [name], PA-C 10/02/17 1509    Charlesetta Shanks, MD 10/02/17 1730

## 2018-02-25 ENCOUNTER — Other Ambulatory Visit: Payer: Self-pay | Admitting: Obstetrics and Gynecology

## 2018-05-23 ENCOUNTER — Other Ambulatory Visit (HOSPITAL_COMMUNITY): Payer: Self-pay

## 2018-05-23 ENCOUNTER — Emergency Department (HOSPITAL_COMMUNITY): Payer: BLUE CROSS/BLUE SHIELD

## 2018-05-23 ENCOUNTER — Observation Stay (HOSPITAL_COMMUNITY)
Admission: EM | Admit: 2018-05-23 | Discharge: 2018-05-24 | Disposition: A | Discharge status: Home / Self Care | Payer: BLUE CROSS/BLUE SHIELD | Attending: Internal Medicine | Admitting: Internal Medicine

## 2018-05-23 ENCOUNTER — Encounter (HOSPITAL_COMMUNITY): Payer: Self-pay

## 2018-05-23 ENCOUNTER — Other Ambulatory Visit: Payer: Self-pay

## 2018-05-23 ENCOUNTER — Emergency Department (HOSPITAL_BASED_OUTPATIENT_CLINIC_OR_DEPARTMENT_OTHER): Payer: BLUE CROSS/BLUE SHIELD

## 2018-05-23 DIAGNOSIS — R609 Edema, unspecified: Secondary | ICD-10-CM

## 2018-05-23 DIAGNOSIS — Z6841 Body Mass Index (BMI) 40.0 and over, adult: Secondary | ICD-10-CM | POA: Insufficient documentation

## 2018-05-23 DIAGNOSIS — F419 Anxiety disorder, unspecified: Secondary | ICD-10-CM | POA: Insufficient documentation

## 2018-05-23 DIAGNOSIS — Z90710 Acquired absence of both cervix and uterus: Secondary | ICD-10-CM | POA: Insufficient documentation

## 2018-05-23 DIAGNOSIS — Z7982 Long term (current) use of aspirin: Secondary | ICD-10-CM | POA: Insufficient documentation

## 2018-05-23 DIAGNOSIS — Z8249 Family history of ischemic heart disease and other diseases of the circulatory system: Secondary | ICD-10-CM | POA: Insufficient documentation

## 2018-05-23 DIAGNOSIS — M7989 Other specified soft tissue disorders: Secondary | ICD-10-CM | POA: Diagnosis not present

## 2018-05-23 DIAGNOSIS — Y9389 Activity, other specified: Secondary | ICD-10-CM | POA: Diagnosis not present

## 2018-05-23 DIAGNOSIS — W1839XA Other fall on same level, initial encounter: Secondary | ICD-10-CM | POA: Insufficient documentation

## 2018-05-23 DIAGNOSIS — Z79899 Other long term (current) drug therapy: Secondary | ICD-10-CM | POA: Insufficient documentation

## 2018-05-23 DIAGNOSIS — R0600 Dyspnea, unspecified: Secondary | ICD-10-CM

## 2018-05-23 DIAGNOSIS — F32A Depression, unspecified: Secondary | ICD-10-CM | POA: Diagnosis present

## 2018-05-23 DIAGNOSIS — G4700 Insomnia, unspecified: Secondary | ICD-10-CM | POA: Insufficient documentation

## 2018-05-23 DIAGNOSIS — I10 Essential (primary) hypertension: Secondary | ICD-10-CM | POA: Diagnosis present

## 2018-05-23 DIAGNOSIS — Y9289 Other specified places as the place of occurrence of the external cause: Secondary | ICD-10-CM | POA: Diagnosis not present

## 2018-05-23 DIAGNOSIS — K219 Gastro-esophageal reflux disease without esophagitis: Secondary | ICD-10-CM | POA: Diagnosis not present

## 2018-05-23 DIAGNOSIS — Y990 Civilian activity done for income or pay: Secondary | ICD-10-CM | POA: Insufficient documentation

## 2018-05-23 DIAGNOSIS — E860 Dehydration: Secondary | ICD-10-CM | POA: Diagnosis not present

## 2018-05-23 DIAGNOSIS — N179 Acute kidney failure, unspecified: Secondary | ICD-10-CM | POA: Diagnosis present

## 2018-05-23 DIAGNOSIS — R55 Syncope and collapse: Secondary | ICD-10-CM | POA: Diagnosis present

## 2018-05-23 DIAGNOSIS — F329 Major depressive disorder, single episode, unspecified: Secondary | ICD-10-CM | POA: Diagnosis present

## 2018-05-23 DIAGNOSIS — Z818 Family history of other mental and behavioral disorders: Secondary | ICD-10-CM | POA: Insufficient documentation

## 2018-05-23 DIAGNOSIS — F319 Bipolar disorder, unspecified: Secondary | ICD-10-CM | POA: Diagnosis not present

## 2018-05-23 LAB — BASIC METABOLIC PANEL
Anion gap: 12 (ref 5–15)
BUN: 24 mg/dL — ABNORMAL HIGH (ref 6–20)
CALCIUM: 8.4 mg/dL — AB (ref 8.9–10.3)
CHLORIDE: 110 mmol/L (ref 98–111)
CO2: 14 mmol/L — ABNORMAL LOW (ref 22–32)
CREATININE: 2.07 mg/dL — AB (ref 0.44–1.00)
GFR calc Af Amer: 33 mL/min — ABNORMAL LOW (ref >60)
GFR calc non Af Amer: 28 mL/min — ABNORMAL LOW (ref >60)
Glucose, Bld: 83 mg/dL (ref 70–99)
Potassium: 3.5 mmol/L (ref 3.5–5.1)
Sodium: 136 mmol/L (ref 135–145)

## 2018-05-23 LAB — D-DIMER, QUANTITATIVE (NOT AT ARMC): D DIMER QUANT: 3.05 ug{FEU}/mL — AB (ref 0.00–0.50)

## 2018-05-23 LAB — TROPONIN I: Troponin I: <0.03 ng/mL (ref <0.03)

## 2018-05-23 LAB — COMPREHENSIVE METABOLIC PANEL
ALBUMIN: 4.3 g/dL (ref 3.5–5.0)
ALK PHOS: 62 U/L (ref 38–126)
ALT: 18 U/L (ref 0–44)
AST: 19 U/L (ref 15–41)
Anion gap: 8 (ref 5–15)
BUN: 22 mg/dL — ABNORMAL HIGH (ref 6–20)
CO2: 21 mmol/L — ABNORMAL LOW (ref 22–32)
CREATININE: 2.34 mg/dL — AB (ref 0.44–1.00)
Calcium: 8.8 mg/dL — ABNORMAL LOW (ref 8.9–10.3)
Chloride: 105 mmol/L (ref 98–111)
GFR, EST AFRICAN AMERICAN: 28 mL/min — AB (ref >60)
GFR, EST NON AFRICAN AMERICAN: 24 mL/min — AB (ref >60)
Glucose, Bld: 90 mg/dL (ref 70–99)
POTASSIUM: 3.8 mmol/L (ref 3.5–5.1)
SODIUM: 134 mmol/L — AB (ref 135–145)
Total Bilirubin: 0.5 mg/dL (ref 0.3–1.2)
Total Protein: 7.4 g/dL (ref 6.5–8.1)

## 2018-05-23 LAB — CBC
HEMATOCRIT: 40.5 % (ref 36.0–46.0)
HEMOGLOBIN: 12.9 g/dL (ref 12.0–15.0)
MCH: 27.6 pg (ref 26.0–34.0)
MCHC: 31.9 g/dL (ref 30.0–36.0)
MCV: 86.5 fL (ref 80.0–100.0)
Platelets: 265 10*3/uL (ref 150–400)
RBC: 4.68 MIL/uL (ref 3.87–5.11)
RDW: 14.6 % (ref 11.5–15.5)
WBC: 11.4 10*3/uL — AB (ref 4.0–10.5)
nRBC: 0 % (ref 0.0–0.2)

## 2018-05-23 LAB — CBC WITH DIFFERENTIAL/PLATELET
Abs Immature Granulocytes: 0.03 10*3/uL (ref 0.00–0.07)
BASOS PCT: 0 %
Basophils Absolute: 0 10*3/uL (ref 0.0–0.1)
Eosinophils Absolute: 0.1 10*3/uL (ref 0.0–0.5)
Eosinophils Relative: 1 %
HCT: 40.3 % (ref 36.0–46.0)
Hemoglobin: 12.7 g/dL (ref 12.0–15.0)
Immature Granulocytes: 0 %
Lymphocytes Relative: 40 %
Lymphs Abs: 3.8 10*3/uL (ref 0.7–4.0)
MCH: 27.9 pg (ref 26.0–34.0)
MCHC: 31.5 g/dL (ref 30.0–36.0)
MCV: 88.6 fL (ref 80.0–100.0)
Monocytes Absolute: 0.7 10*3/uL (ref 0.1–1.0)
Monocytes Relative: 7 %
Neutro Abs: 4.8 10*3/uL (ref 1.7–7.7)
Neutrophils Relative %: 52 %
Platelets: 239 10*3/uL (ref 150–400)
RBC: 4.55 MIL/uL (ref 3.87–5.11)
RDW: 15 % (ref 11.5–15.5)
WBC: 9.4 10*3/uL (ref 4.0–10.5)
nRBC: 0 % (ref 0.0–0.2)

## 2018-05-23 LAB — CORTISOL: Cortisol, Plasma: 3 ug/dL

## 2018-05-23 LAB — TSH: TSH: 0.675 u[IU]/mL (ref 0.350–4.500)

## 2018-05-23 LAB — BRAIN NATRIURETIC PEPTIDE: B Natriuretic Peptide: 10.1 pg/mL (ref 0.0–100.0)

## 2018-05-23 LAB — LACTIC ACID, PLASMA: Lactic Acid, Venous: 1.3 mmol/L (ref 0.5–1.9)

## 2018-05-23 LAB — CK: CK TOTAL: 85 U/L (ref 38–234)

## 2018-05-23 LAB — PHOSPHORUS: Phosphorus: 3.2 mg/dL (ref 2.5–4.6)

## 2018-05-23 LAB — MAGNESIUM: Magnesium: 2.5 mg/dL — ABNORMAL HIGH (ref 1.7–2.4)

## 2018-05-23 LAB — CBG MONITORING, ED: GLUCOSE-CAPILLARY: 78 mg/dL (ref 70–99)

## 2018-05-23 MED ORDER — LURASIDONE HCL 40 MG PO TABS
40.0000 mg | ORAL_TABLET | Freq: Every day | ORAL | Status: DC
  Administered 2018-05-23: 40 mg via ORAL
  Filled 2018-05-23: qty 1

## 2018-05-23 MED ORDER — HEPARIN BOLUS VIA INFUSION
2500.0000 [IU] | Freq: Once | INTRAVENOUS | Status: AC
  Administered 2018-05-23: 2500 [IU] via INTRAVENOUS
  Filled 2018-05-23: qty 2500

## 2018-05-23 MED ORDER — ALPRAZOLAM 1 MG PO TABS: 1.0000 mg | ORAL_TABLET | Freq: Two times a day (BID) | ORAL | Status: DC | PRN

## 2018-05-23 MED ORDER — ONDANSETRON HCL 4 MG/2ML IJ SOLN: 4.0000 mg | Freq: Four times a day (QID) | INTRAMUSCULAR | Status: DC | PRN

## 2018-05-23 MED ORDER — TOPIRAMATE 100 MG PO TABS
100.0000 mg | ORAL_TABLET | Freq: Every day | ORAL | Status: DC
  Administered 2018-05-23: 100 mg via ORAL
  Filled 2018-05-23: qty 1

## 2018-05-23 MED ORDER — SODIUM CHLORIDE 0.9 % IV BOLUS
500.0000 mL | Freq: Once | INTRAVENOUS | Status: AC
  Administered 2018-05-23: 500 mL via INTRAVENOUS

## 2018-05-23 MED ORDER — HEPARIN (PORCINE) 25000 UT/250ML-% IV SOLN
1500.0000 [IU]/h | INTRAVENOUS | Status: DC
  Administered 2018-05-23: 1500 [IU]/h via INTRAVENOUS
  Filled 2018-05-23 (×2): qty 250

## 2018-05-23 MED ORDER — SODIUM CHLORIDE 0.9 % IV SOLN
INTRAVENOUS | Status: AC
  Administered 2018-05-23: 21:00:00 via INTRAVENOUS

## 2018-05-23 MED ORDER — ENOXAPARIN SODIUM 40 MG/0.4ML ~~LOC~~ SOLN
40.0000 mg | SUBCUTANEOUS | Status: DC
  Administered 2018-05-23: 40 mg via SUBCUTANEOUS
  Filled 2018-05-23: qty 0.4

## 2018-05-23 MED ORDER — ACETAMINOPHEN 325 MG PO TABS: 650.0000 mg | ORAL_TABLET | Freq: Four times a day (QID) | ORAL | Status: DC | PRN

## 2018-05-23 MED ORDER — CITALOPRAM HYDROBROMIDE 20 MG PO TABS
40.0000 mg | ORAL_TABLET | Freq: Every day | ORAL | Status: DC
  Administered 2018-05-23: 40 mg via ORAL
  Filled 2018-05-23: qty 2

## 2018-05-23 MED ORDER — TECHNETIUM TO 99M ALBUMIN AGGREGATED
4.2800 | Freq: Once | INTRAVENOUS | Status: AC | PRN
  Administered 2018-05-23: 4.28 via INTRAVENOUS

## 2018-05-23 MED ORDER — ZOLPIDEM TARTRATE 5 MG PO TABS
5.0000 mg | ORAL_TABLET | Freq: Every evening | ORAL | Status: DC | PRN
  Administered 2018-05-23: 5 mg via ORAL
  Filled 2018-05-23: qty 1

## 2018-05-23 MED ORDER — ONDANSETRON HCL 4 MG PO TABS: 4.0000 mg | ORAL_TABLET | Freq: Four times a day (QID) | ORAL | Status: DC | PRN

## 2018-05-23 MED ORDER — SODIUM CHLORIDE 0.9 % IV BOLUS
1000.0000 mL | Freq: Once | INTRAVENOUS | Status: AC
  Administered 2018-05-23: 1000 mL via INTRAVENOUS

## 2018-05-23 MED ORDER — HYDROCODONE-ACETAMINOPHEN 5-325 MG PO TABS: 1.0000 | ORAL_TABLET | ORAL | Status: DC | PRN

## 2018-05-23 MED ORDER — TECHNETIUM TC 99M DIETHYLENETRIAME-PENTAACETIC ACID
32.8000 | Freq: Once | INTRAVENOUS | Status: AC | PRN
  Administered 2018-05-23: 32.8 via RESPIRATORY_TRACT

## 2018-05-23 MED ORDER — ZOLPIDEM TARTRATE 5 MG PO TABS: 5.0000 mg | ORAL_TABLET | Freq: Every evening | ORAL | Status: DC | PRN

## 2018-05-23 MED ORDER — ACETAMINOPHEN 650 MG RE SUPP: 650.0000 mg | Freq: Four times a day (QID) | RECTAL | Status: DC | PRN

## 2018-05-23 NOTE — Progress Notes (Signed)
Lower extremity venous duplex has been completed.   Preliminary results in CV Proc.   [name] 05/23/2018 3:13 PM

## 2018-05-23 NOTE — ED Notes (Signed)
Patient transported to Nuclear Medicine at this time.

## 2018-05-23 NOTE — Progress Notes (Signed)
  Echocardiogram 2D Echocardiogram has been attempted. Patient leaving for lung scan.  Shelby Howard 05/23/2018, 4:14 PM

## 2018-05-23 NOTE — ED Triage Notes (Signed)
Patient had a sudden onset of SOB, dizziness and an elevated heart rate at work >120. Patient also had a fall at work, but did not hit her head.

## 2018-05-23 NOTE — ED Provider Notes (Signed)
[facility] DEPT Provider Note   CSN: [id number] Arrival date & time: 05/23/18  1154    History   Chief Complaint Chief Complaint  Patient presents with  . Shortness of Breath  . Dizziness  . Fall  . Hypotension    HPI Shelby Howard is a 48 y.o. female.     HPI Patient is 48 year old female who developed sudden onset shortness of breath dizziness and near syncope while at work.  She was found to have a heart rate greater than 120 and a blood pressure in the 80s.  She states new exertional shortness of breath today.  No history of DVT or pulmonary embolism.  No recent cough.  No fevers or chills.  No unilateral arm or leg swelling.  No recent travel or surgery.  Symptoms are moderate in severity.     Past Medical History:  Diagnosis Date  . Anemia    history  . Anxiety   . Bipolar disorder ([place])   . Depression   . 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 Fibroids   . Hypertension   . Insomnia     Patient Active Problem List   Diagnosis Date Noted  . Obesity, Class III, BMI 40-49.9 (morbid obesity) ([place])   . Gastroesophageal reflux disease   . Benign essential HTN   . Hypokalemia   . Intractable nausea and vomiting 01/20/2017  . Alcohol use disorder, mild, abuse 11/12/2015  . Bipolar I disorder, most recent episode depressed ([place]) 11/12/2015  . Fibroids   . Bipolar disorder ([place])   . Anxiety   . Depression   . Insomnia   . 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 ABDOMINAL HYSTERECTOMY    . CERVICAL CONIZATION W/BX  03/02/2011   Procedure: CONIZATION CERVIX WITH BIOPSY;  Surgeon: Betsy Coder, MD;  Location: [facility];  Service: Gynecology;  Laterality: N/A;  Cold Knife Conization, Endocervical Curretage  . CYSTOSCOPY  09/06/2011   Procedure: CYSTOSCOPY;  Surgeon: Betsy Coder, MD;  Location: [facility];  Service: Gynecology;   Laterality: N/A;  . DILATION AND CURETTAGE OF UTERUS  09/2007   resection of polyp  . LAPAROSCOPIC HYSTERECTOMY  09/06/2011   Procedure: HYSTERECTOMY TOTAL LAPAROSCOPIC;  Surgeon: Betsy Coder, MD;  Location: [facility];  Service: Gynecology;  Laterality: N/A;  . SVD  1998   x 1     OB History    Gravida  1   Para  1   Term      Preterm      AB      Living  1     SAB      TAB      Ectopic      Multiple      Live Births               Home Medications    Prior to Admission medications   Medication Sig Start Date End Date Taking? Authorizing Provider  ALPRAZolam Duanne Moron) 1 MG tablet Take 1 mg by mouth 2 (two) times daily as needed for anxiety. 05/30/16  Yes [provider]  aspirin 325 MG tablet Take 650 mg by mouth daily.   Yes [provider]  citalopram (CELEXA) 40 MG tablet Take 40 mg by mouth daily. 04/02/18  Yes [provider]  hydrochlorothiazide (HYDRODIURIL) 25 MG tablet Take 25 mg by mouth daily.  Yes [provider]  lisinopril (PRINIVIL,ZESTRIL) 5 MG tablet Take 1 tablet (5 mg total) by mouth daily. For high blood pressure 01/23/17  Yes Barton Dubois, MD  lurasidone (LATUDA) 40 MG TABS tablet Take 1 tablet (40 mg total) by mouth daily with breakfast. For mood control Patient taking differently: Take 40 mg by mouth at bedtime. For mood control 11/15/15  Yes Lindell Spar I, NP  Multiple Vitamin (MULTIVITAMIN) tablet Take 1 tablet by mouth daily.   Yes [provider]  potassium chloride (K-DUR,KLOR-CON) 10 MEQ tablet Take 10 mEq by mouth daily.   Yes [provider]  topiramate (TOPAMAX) 50 MG tablet Take 100 mg by mouth at bedtime.  02/26/18  Yes [provider]  zaleplon (SONATA) 5 MG capsule 5 mg at bedtime as needed for sleep.  08/20/17  Yes [provider]  zolpidem (AMBIEN) 10 MG tablet Take 1 tablet (10 mg total) by mouth at bedtime as needed for sleep. 11/15/15  Yes Nwoko, Herbert Pun I, NP    ciprofloxacin (CIPRO) 500 MG tablet Take 1 tablet (500 mg total) by mouth 2 (two) times daily. Patient not taking: Reported on 05/23/2018 10/02/17   Frederica Kuster, PA-C  dicyclomine (BENTYL) 20 MG tablet Take 1 tablet (20 mg total) by mouth 2 (two) times daily. Patient not taking: Reported on 05/23/2018 10/02/17   Frederica Kuster, PA-C  gabapentin (NEURONTIN) 100 MG capsule Take 1 capsule (100 mg total) by mouth 3 (three) times daily. For agitation Patient not taking: Reported on 06/12/2016 11/15/15   Lindell Spar I, NP  hydrALAZINE (APRESOLINE) 25 MG tablet Take 1 tablet (25 mg total) by mouth 3 (three) times daily. Patient not taking: Reported on 05/23/2018 01/23/17 01/23/18  Barton Dubois, MD  HYDROcodone-acetaminophen (NORCO/VICODIN) 5-325 MG tablet Take 1-2 tablets by mouth every 6 (six) hours as needed. Patient not taking: Reported on 05/23/2018 10/02/17   Frederica Kuster, PA-C  metroNIDAZOLE (FLAGYL) 500 MG tablet Take 1 tablet (500 mg total) by mouth 3 (three) times daily. Patient not taking: Reported on 05/23/2018 10/02/17   Frederica Kuster, PA-C  ondansetron (ZOFRAN ODT) 4 MG disintegrating tablet Take 1 tablet (4 mg total) by mouth every 8 (eight) hours as needed for nausea. Patient not taking: Reported on 05/23/2018 09/05/17   Larene Pickett, PA-C  ondansetron (ZOFRAN) 4 MG tablet Take 1 tablet (4 mg total) by mouth every 6 (six) hours. Patient not taking: Reported on 05/23/2018 10/02/17   Frederica Kuster, PA-C  pantoprazole (PROTONIX) 40 MG tablet Take 1 tablet (40 mg total) by mouth daily. Patient not taking: Reported on 05/23/2018 01/23/17 05/23/18  Barton Dubois, MD    Family History Family History  Problem Relation Age of Onset  . Hypertension Mother   . Bipolar disorder Mother   . Bipolar disorder Sister     Social History Social History   Tobacco Use  . Smoking status: Never Smoker  . Smokeless tobacco: Never Used  Substance Use Topics  . Alcohol use: Not Currently     Alcohol/week: 4.0 standard drinks    Types: 4 Glasses of wine per week  . Drug use: No     Allergies   Patient has no known allergies.   Review of Systems Review of Systems  All other systems reviewed and are negative.    Physical Exam Updated Vital Signs BP 112/75 (BP Location: Left Arm)   Pulse 89   Temp 97.8 F (36.6 C) (Oral)   Resp Marland Kitchen)  21   Ht 5\' 7"  (1.702 m)   Wt 107.5 kg   LMP 08/07/2011   SpO2 100%   BMI 37.12 kg/m   Physical Exam Vitals signs and nursing note reviewed.  Constitutional:      General: She is not in acute distress.    Appearance: She is well-developed.  HENT:     Head: Normocephalic and atraumatic.  Neck:     Musculoskeletal: Normal range of motion.  Cardiovascular:     Rate and Rhythm: Regular rhythm. Tachycardia present.     Heart sounds: Normal heart sounds.  Pulmonary:     Effort: Pulmonary effort is normal.     Breath sounds: Normal breath sounds.  Abdominal:     General: There is no distension.     Palpations: Abdomen is soft.     Tenderness: There is no abdominal tenderness.  Musculoskeletal: Normal range of motion.  Skin:    General: Skin is warm and dry.  Neurological:     Mental Status: She is alert and oriented to person, place, and time.  Psychiatric:        Judgment: Judgment normal.      ED Treatments / Results  Labs (all labs ordered are listed, but only abnormal results are displayed) Labs Reviewed  CBC - Abnormal; Notable for the following components:      Result Value   WBC 11.4 (*)    All other components within normal limits  COMPREHENSIVE METABOLIC PANEL - Abnormal; Notable for the following components:   Sodium 134 (*)    CO2 21 (*)    BUN 22 (*)    Creatinine, Ser 2.34 (*)    Calcium 8.8 (*)    GFR calc non Af Amer 24 (*)    GFR calc Af Amer 28 (*)    All other components within normal limits  D-DIMER, QUANTITATIVE (NOT AT [facility]) - Abnormal; Notable for the following components:   D-Dimer,  Quant 3.05 (*)    All other components within normal limits  TROPONIN I  CBG MONITORING, ED    EKG EKG Interpretation  Date/Time:  Friday May 23 2018 12:13:03 EST Ventricular Rate:  123 PR Interval:    QRS Duration: 90 QT Interval:  315 QTC Calculation: 451 R Axis:   75 Text Interpretation:  Sinus tachycardia Borderline T abnormalities, anterior leads Baseline wander in lead(s) III No significant change was found Confirmed by Jola Schmidt [phone]) on 05/23/2018 2:35:51 PM   Radiology Dg Chest 2 View  Result Date: 05/23/2018 CLINICAL DATA:  Patient had a sudden onset of SOB, dizziness and an elevated heart rate at work >120, nonsmoker, no other chest complaints EXAM: CHEST - 2 VIEW COMPARISON:  11/04/2014 FINDINGS: The heart size and mediastinal contours are within normal limits. Both lungs are clear. No pleural effusion or pneumothorax. The visualized skeletal structures are unremarkable. IMPRESSION: Normal chest radiographs. Electronically Signed   By: Lajean Manes M.D.   On: 05/23/2018 13:28    Procedures .Critical Care Performed by: Jola Schmidt, MD Authorized by: Jola Schmidt, MD   Critical care provider statement:    Critical care time (minutes):  32   Critical care was time spent personally by me on the following activities:  Discussions with consultants, evaluation of patient's response to treatment, examination of patient, ordering and performing treatments and interventions, ordering and review of laboratory studies, ordering and review of radiographic studies, pulse oximetry, re-evaluation of patient's condition, obtaining history from patient or surrogate and review  of old charts   (including critical care time)  Medications Ordered in ED Medications - No data to display   Initial Impression / Assessment and Plan / ED Course  I have reviewed the triage vital signs and the nursing notes.  Pertinent labs & imaging results that were available during my care  of the patient were reviewed by me and considered in my medical decision making (see chart for details).        High clinical concern for pulmonary embolism given acute onset shortness of breath with associated tachycardia and hypotension.  No exertional shortness of breath.  D-dimer elevated to 3.05.  Patient will be initiated on heparin at this time.  She will undergo bilateral lower extremity venous duplex.  Her creatinine is up and she cannot tolerate a CT angiogram of her chest at this time.  I have ordered a VQ scan.  Given her habitus this may be a somewhat poor study.  Will discuss further with the hospitalist.  The other option for her would be a bedside echocardiogram followed by gentle hydration through the day and CT angiogram of her chest once her creatinine improves.  Final Clinical Impressions(s) / ED Diagnoses   Final diagnoses:  None    ED Discharge Orders    None       Jola Schmidt, MD 05/23/18 [phone]

## 2018-05-23 NOTE — ED Notes (Signed)
Venora Maples, MD made aware of patients BP. Verbal order to start NS fluid bolus. Fluid bolus started.

## 2018-05-23 NOTE — ED Notes (Signed)
Patient transported to X-ray 

## 2018-05-23 NOTE — ED Provider Notes (Signed)
  Provider Note MRN:  [id number]  Arrival date & time: 05/23/18    ED Course and Medical Decision Making  I received sign out for this patient at shift change from Dr. Venora Maples.  VQ negative, admitted to hospitalist service.  Barth Kirks. Sedonia Small, [place] [email or web address]     Maudie Flakes, MD 05/23/18 [phone]

## 2018-05-23 NOTE — Progress Notes (Signed)
ANTICOAGULATION CONSULT NOTE - Initial Consult  Pharmacy Consult for IV heparin Indication: pulmonary embolus  No Known Allergies  Patient Measurements: Height: 5\' 7"  (170.2 cm) Weight: 237 lb (107.5 kg) IBW/kg (Calculated) : 61.6 Heparin Dosing Weight: 86.2  Vital Signs: Temp: 97.8 F (36.6 C) (02/28 1215) Temp Source: Oral (02/28 1215) BP: 112/75 (02/28 1414) Pulse Rate: 89 (02/28 1414)  Labs: Recent Labs    05/23/18 1335  HGB 12.9  HCT 40.5  PLT 265  CREATININE 2.34*  TROPONINI <0.03    Estimated Creatinine Clearance: 38.3 mL/min (A) (by C-G formula based on SCr of 2.34 mg/dL (H)).   Medical History: Past Medical History:  Diagnosis Date  . Anemia    history  . Anxiety   . Bipolar disorder ([place])   . Depression   . 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 Fibroids   . Hypertension   . Insomnia     Medications:  Scheduled:  Infusions:   Assessment: 48 yo female presented to ED with SOB, dizziness, fall, and hypotension. To start IV heparin for high clinical concern for PE per ED Md along with elevated Ddimer. VQ Scan to be done - follow up results. Baseline labs drawn - CBC good, SCr elevated at 2.34   Goal of Therapy:  Heparin level 0.3-0.7 units/ml Monitor platelets by anticoagulation protocol: Yes   Plan:  1) IV heparin 2500 unit bolus then 2) IV heparin rate of 1500 units/hr 3) Check heparin level 8 hours after start of IV heparin 4) Daily CBC and heparin level 5) Follow up VQ scan results  Adrian Saran, PharmD, BCPS Pager [phone] 05/23/2018 2:39 PM

## 2018-05-23 NOTE — H&P (Signed)
Shelby Howard MCN:[id number] DOB: 22-Feb-1971 DOA: 05/23/2018     PCP: Audley Hose, MD   Outpatient Specialists:  NONE    Patient arrived to ER on 05/23/18 at 1154  Patient coming from: home Lives alone,     Chief Complaint:  Chief Complaint  Patient presents with  . Shortness of Breath  . Dizziness  . Fall  . Hypotension    HPI: Shelby Howard is a 48 y.o. female with medical history significant of obesity, GERD, HTN, bipolar and anxiety     Presented with sudden onset of shortness of breath lightheadedness and tachycardia her heart rate was about 120s she was at work at that time.  She actually fell down at work denies hitting her head apparently syncopal episode.  Her blood pressure at the time was in low 80s.  No prior history of DVT or pulmonary embolism no recent cough fevers or chills no leg swelling.  No recent travel or surgery. No sick contacts No change in urine color she said but have not urinated much today.  She usually drinks a lot of water No nausea/voting no diarrhea, no change in medications and she has taken them all in AM.  She is sp hysterectomy She recently been taking super greens supplements.  She has been eating nothing but salad and peanuts for the past 1 week.   She had a physical last Friday every thing was ok.   This morning she woke up all she had to eats was peanuts she felt dizzy and short of breath her mouth was tingling she got up to give paper work to some one and stumbled and fell down no true syncope   She took [place] home called her mom who brought in Blood pressure cuff. They checked  BP in was in 65's and Hr was in 120  So her family took her to [facility] ER.  She took her BP medication this AM   She eats nuts frequently   Regarding pertinent Chronic problems:   Obesity -Body mass index is 43.09 kg/m. Has known history of hypertension for which she takes hydrochlorothiazide and lisinopril and hydralazine  While in ER: IV  fluids was given  d-dimer was elevated at 3 She was started on heparin because pretest probability for PE was very high Patient had AKI so CTA could not be ordered VQ scan ordered and was negative Dopplers of lower extremities bilaterally also showed no evidence of DVT  Patient noted to have AKI and we were asked to admit for this  The following Work up has been ordered so far:  Orders Placed This Encounter  Procedures  . Critical Care  . DG Chest 2 View  . NM PULMONARY VENT AND PERF (V/Q Scan)  . CBC  . Comprehensive metabolic panel  . D-dimer, quantitative (not at [facility])  . Troponin I - ONCE - STAT  . CBC  . Heparin level (unfractionated)  . Diet NPO time specified  . If O2 Sat <94% administer O2 at 2 liters/minute via nasal cannula  . heparin per pharmacy consult  . Consult to hospitalist  . Consult to hospitalist  . Pulse oximetry, continuous  . CBG monitoring, ED  . ED EKG  . EKG 12-Lead  . ECHOCARDIOGRAM COMPLETE     Following Medications were ordered in ER: Medications  heparin ADULT infusion 100 units/mL (25000 units/246mL sodium chloride 0.45%) (1,500 Units/hr Intravenous New Bag/Given 05/23/18 1458)  sodium chloride 0.9 % bolus 1,000 mL (  has no administration in time range)  heparin bolus via infusion 2,500 Units (2,500 Units Intravenous Bolus from Bag 05/23/18 1456)  sodium chloride 0.9 % bolus 500 mL (500 mLs Intravenous New Bag/Given 05/23/18 1806)  technetium TC 75M diethylenetriame-pentaacetic acid (DTPA) injection 41.9 millicurie (62.2 millicuries Inhalation Given 05/23/18 1620)  technetium albumin aggregated (MAA) injection solution 2.97 millicurie (9.89 millicuries Intravenous Contrast Given 05/23/18 1645)    Significant initial  Findings: Abnormal Labs Reviewed  CBC - Abnormal; Notable for the following components:      Result Value   WBC 11.4 (*)    All other components within normal limits  COMPREHENSIVE METABOLIC PANEL - Abnormal; Notable for the  following components:   Sodium 134 (*)    CO2 21 (*)    BUN 22 (*)    Creatinine, Ser 2.34 (*)    Calcium 8.8 (*)    GFR calc non Af Amer 24 (*)    GFR calc Af Amer 28 (*)    All other components within normal limits  D-DIMER, QUANTITATIVE (NOT AT [facility]) - Abnormal; Notable for the following components:   D-Dimer, Quant 3.05 (*)    All other components within normal limits     Lactic Acid, Venous    Component Value Date/Time   LATICACIDVEN 1.65 01/20/2017 0733    Na 134 K 3.8  Cr    Up from baseline see below Lab Results  Component Value Date   CREATININE 2.34 (H) 05/23/2018   CREATININE 0.69 10/02/2017   CREATININE 0.66 09/04/2017      WBC  11.4 have been elevated in the past up to 20.5  HG/HCT stable,     Component Value Date/Time   HGB 12.9 05/23/2018 1335   HCT 40.5 05/23/2018 1335       Troponin <0.03   BNP (last 3 results) No results for input(s): BNP in the last 8760 hours.  ProBNP (last 3 results) No results for input(s): PROBNP in the last 8760 hours.     UA  ordered  VQ scan neg for PE   CXR -  NON acute    ECG:  Personally reviewed by me showing: HR : 123 Rhythm Sinus tachycardia nonspecific changes, QTC 451      ED Triage Vitals  Enc Vitals Group     BP 05/23/18 1215 (!) 86/75     Pulse Rate 05/23/18 1215 (!) 136     Resp 05/23/18 1215 (!) 25     Temp 05/23/18 1215 97.8 F (36.6 C)     Temp Source 05/23/18 1215 Oral     SpO2 05/23/18 1215 100 %     Weight 05/23/18 1205 237 lb (107.5 kg)     Height 05/23/18 1205 5\' 7"  (1.702 m)     Head Circumference --      Peak Flow --      Pain Score 05/23/18 1205 0     Pain Loc --      Pain Edu? --      Excl. in [place]? --   TMAX(24)@       Latest  Blood pressure 127/73, pulse 89, temperature 97.8 F (36.6 C), temperature source Oral, resp. rate 16, height 5\' 7"  (1.702 m), weight 107.5 kg, last menstrual period 08/07/2011, SpO2 99 %.      Hospitalist was called for admission for  AKI    Review of Systems:    Pertinent positives include:  Fatigue, syncope  Constitutional:  No weight loss, night sweats,  Fevers, chills, weight loss  HEENT:  No headaches, Difficulty swallowing,Tooth/dental problems,Sore throat,  No sneezing, itching, ear ache, nasal congestion, post nasal drip,  Cardio-vascular:  No chest pain, Orthopnea, PND, anasarca, dizziness, palpitations.no Bilateral lower extremity swelling  GI:  No heartburn, indigestion, abdominal pain, nausea, vomiting, diarrhea, change in bowel habits, loss of appetite, melena, blood in stool, hematemesis Resp:  no shortness of breath at rest. No dyspnea on exertion, No excess mucus, no productive cough, No non-productive cough, No coughing up of blood.No change in color of mucus.No wheezing. Skin:  no rash or lesions. No jaundice GU:  no dysuria, change in color of urine, no urgency or frequency. No straining to urinate.  No flank pain.  Musculoskeletal:  No joint pain or no joint swelling. No decreased range of motion. No back pain.  Psych:  No change in mood or affect. No depression or anxiety. No memory loss.  Neuro: no localizing neurological complaints, no tingling, no weakness, no double vision, no gait abnormality, no slurred speech, no confusion  All systems reviewed and apart from [place] all are negative  Past Medical History:   Past Medical History:  Diagnosis Date  . Anemia    history  . Anxiety   . Bipolar disorder ([place])   . Depression   . 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 Fibroids   . Hypertension   . Insomnia       Past Surgical History:  Procedure Laterality Date  . ABDOMINAL HYSTERECTOMY    . CERVICAL CONIZATION W/BX  03/02/2011   Procedure: CONIZATION CERVIX WITH BIOPSY;  Surgeon: Betsy Coder, MD;  Location: [facility];  Service: Gynecology;  Laterality: N/A;  Cold Knife Conization, Endocervical Curretage  . CYSTOSCOPY  09/06/2011   Procedure: CYSTOSCOPY;  Surgeon: Betsy Coder,  MD;  Location: [facility];  Service: Gynecology;  Laterality: N/A;  . DILATION AND CURETTAGE OF UTERUS  09/2007   resection of polyp  . LAPAROSCOPIC HYSTERECTOMY  09/06/2011   Procedure: HYSTERECTOMY TOTAL LAPAROSCOPIC;  Surgeon: Betsy Coder, MD;  Location: [facility];  Service: Gynecology;  Laterality: N/A;  . SVD  1998   x 1    Social History:  Ambulatory  Independently      reports that she has never smoked. She has never used smokeless tobacco. She reports previous alcohol use of about 4.0 standard drinks of alcohol per week. She reports that she does not use drugs.     Family History:   Family History  Problem Relation Age of Onset  . Hypertension Mother   . Bipolar disorder Mother   . Bipolar disorder Sister     Allergies: No Known Allergies   Prior to Admission medications   Medication Sig Start Date End Date Taking? Authorizing Provider  ALPRAZolam Duanne Moron) 1 MG tablet Take 1 mg by mouth 2 (two) times daily as needed for anxiety. 05/30/16  Yes [provider]  aspirin 325 MG tablet Take 650 mg by mouth daily.   Yes [provider]  citalopram (CELEXA) 40 MG tablet Take 40 mg by mouth daily. 04/02/18  Yes [provider]  hydrochlorothiazide (HYDRODIURIL) 25 MG tablet Take 25 mg by mouth daily.   Yes [provider]  lisinopril (PRINIVIL,ZESTRIL) 5 MG tablet Take 1 tablet (5 mg total) by mouth daily. For high blood pressure 01/23/17  Yes Barton Dubois, MD  lurasidone (LATUDA) 40 MG TABS tablet Take 1 tablet (40 mg total) by mouth daily with breakfast. For mood control  Patient taking differently: Take 40 mg by mouth at bedtime. For mood control 11/15/15  Yes Lindell Spar I, NP  Multiple Vitamin (MULTIVITAMIN) tablet Take 1 tablet by mouth daily.   Yes [provider]  potassium chloride (K-DUR,KLOR-CON) 10 MEQ tablet Take 10 mEq by mouth daily.   Yes [provider]  topiramate (TOPAMAX) 50 MG tablet Take 100 mg by mouth at  bedtime.  02/26/18  Yes [provider]  zaleplon (SONATA) 5 MG capsule 5 mg at bedtime as needed for sleep.  08/20/17  Yes [provider]  zolpidem (AMBIEN) 10 MG tablet Take 1 tablet (10 mg total) by mouth at bedtime as needed for sleep. 11/15/15  Yes Nwoko, Herbert Pun I, NP  ciprofloxacin (CIPRO) 500 MG tablet Take 1 tablet (500 mg total) by mouth 2 (two) times daily. Patient not taking: Reported on 05/23/2018 10/02/17   Frederica Kuster, PA-C  dicyclomine (BENTYL) 20 MG tablet Take 1 tablet (20 mg total) by mouth 2 (two) times daily. Patient not taking: Reported on 05/23/2018 10/02/17   Frederica Kuster, PA-C  gabapentin (NEURONTIN) 100 MG capsule Take 1 capsule (100 mg total) by mouth 3 (three) times daily. For agitation Patient not taking: Reported on 06/12/2016 11/15/15   Lindell Spar I, NP  hydrALAZINE (APRESOLINE) 25 MG tablet Take 1 tablet (25 mg total) by mouth 3 (three) times daily. Patient not taking: Reported on 05/23/2018 01/23/17 01/23/18  Barton Dubois, MD  HYDROcodone-acetaminophen (NORCO/VICODIN) 5-325 MG tablet Take 1-2 tablets by mouth every 6 (six) hours as needed. Patient not taking: Reported on 05/23/2018 10/02/17   Frederica Kuster, PA-C  metroNIDAZOLE (FLAGYL) 500 MG tablet Take 1 tablet (500 mg total) by mouth 3 (three) times daily. Patient not taking: Reported on 05/23/2018 10/02/17   Frederica Kuster, PA-C  ondansetron (ZOFRAN ODT) 4 MG disintegrating tablet Take 1 tablet (4 mg total) by mouth every 8 (eight) hours as needed for nausea. Patient not taking: Reported on 05/23/2018 09/05/17   Larene Pickett, PA-C  ondansetron (ZOFRAN) 4 MG tablet Take 1 tablet (4 mg total) by mouth every 6 (six) hours. Patient not taking: Reported on 05/23/2018 10/02/17   Frederica Kuster, PA-C  pantoprazole (PROTONIX) 40 MG tablet Take 1 tablet (40 mg total) by mouth daily. Patient not taking: Reported on 05/23/2018 01/23/17 05/23/18  Barton Dubois, MD   Physical Exam: Blood pressure  127/73, pulse 89, temperature 97.8 F (36.6 C), temperature source Oral, resp. rate 16, height 5\' 7"  (1.702 m), weight 107.5 kg, last menstrual period 08/07/2011, SpO2 99 %. 1. General:  in No  Acute distress   well  -appearing 2. Psychological: Alert and   Oriented 3. Head/ENT:    Dry Mucous Membranes                          Head Non traumatic, neck supple                          Normal  Dentition 4. SKIN: normal Skin turgor,  Skin clean Dry and intact no rash 5. Heart: Regular rate and rhythm no  Murmur, no Rub or gallop 6. Lungs: Clear to auscultation bilaterally distant  7. Abdomen: Soft,  non-tender, Non distended   obese  bowel sounds present 8. Lower extremities: no clubbing, cyanosis, no  edema 9. Neurologically Grossly intact, moving all 4 extremities equally   10. MSK: Normal range  of motion   LABS:     Recent Labs  Lab 05/23/18 1335  WBC 11.4*  HGB 12.9  HCT 40.5  MCV 86.5  PLT 161   Basic Metabolic Panel: Recent Labs  Lab 05/23/18 1335  NA 134*  K 3.8  CL 105  CO2 21*  GLUCOSE 90  BUN 22*  CREATININE 2.34*  CALCIUM 8.8*      Recent Labs  Lab 05/23/18 1335  AST 19  ALT 18  ALKPHOS 62  BILITOT 0.5  PROT 7.4  ALBUMIN 4.3   No results for input(s): LIPASE, AMYLASE in the last 168 hours. No results for input(s): AMMONIA in the last 168 hours.    HbA1C: No results for input(s): HGBA1C in the last 72 hours. CBG: Recent Labs  Lab 05/23/18 1241  GLUCAP 78      Urine analysis: Cultures: No results found for: SDES, SPECREQUEST, CULT, REPTSTATUS   Radiological Exams on Admission: Dg Chest 2 View  Result Date: 05/23/2018 CLINICAL DATA:  Patient had a sudden onset of SOB, dizziness and an elevated heart rate at work >120, nonsmoker, no other chest complaints EXAM: CHEST - 2 VIEW COMPARISON:  11/04/2014 FINDINGS: The heart size and mediastinal contours are within normal limits. Both lungs are clear. No pleural effusion or pneumothorax. The  visualized skeletal structures are unremarkable. IMPRESSION: Normal chest radiographs. Electronically Signed   By: Lajean Manes M.D.   On: 05/23/2018 13:28   Nm Pulmonary Vent And Perf (v/q Scan)  Result Date: 05/23/2018 CLINICAL DATA:  Shortness of breath and renal insufficiency. EXAM: NUCLEAR MEDICINE VENTILATION - PERFUSION LUNG SCAN TECHNIQUE: Ventilation images were obtained in multiple projections using inhaled aerosol Tc-25m DTPA. Perfusion images were obtained in multiple projections after intravenous injection of Tc-65m MAA. RADIOPHARMACEUTICALS:  33 mCi of Tc-30m DTPA aerosol inhalation and 4.3 mCi Tc18m MAA IV COMPARISON:  None. FINDINGS: Ventilation: Ventilatory imaging demonstrates normal distribution of DTPA aerosol throughout both lungs without significant ventilatory defect. Perfusion: Perfusion imaging is normal with no perfusion defects identified in either lung. IMPRESSION: Normal nuclear medicine ventilation and perfusion scintigraphy demonstrating no evidence of pulmonary embolism. Electronically Signed   By: Aletta Edouard M.D.   On: 05/23/2018 17:36   Vas [place] Lower Extremity Venous (dvt) (only [facility])  Result Date: 05/23/2018  Lower Venous Study Indications: Swelling, and Edema.  Performing Technologist: [name] RVS  Examination Guidelines: A complete evaluation includes B-mode imaging, spectral Doppler, color Doppler, and power Doppler as needed of all accessible portions of each vessel. Bilateral testing is considered an integral part of a complete examination. Limited examinations for reoccurring indications may be performed as noted.  Right Venous Findings: +---------+---------------+---------+-----------+----------+-------+          CompressibilityPhasicitySpontaneityPropertiesSummary +---------+---------------+---------+-----------+----------+-------+ CFV      Full           Yes      Yes                           +---------+---------------+---------+-----------+----------+-------+ SFJ      Full                                                 +---------+---------------+---------+-----------+----------+-------+ FV Prox  Full                                                 +---------+---------------+---------+-----------+----------+-------+  FV Mid   Full                                                 +---------+---------------+---------+-----------+----------+-------+ FV DistalFull                                                 +---------+---------------+---------+-----------+----------+-------+ PFV      Full                                                 +---------+---------------+---------+-----------+----------+-------+ POP      Full           Yes      Yes                          +---------+---------------+---------+-----------+----------+-------+ PTV      Full                                                 +---------+---------------+---------+-----------+----------+-------+ PERO     Full                                                 +---------+---------------+---------+-----------+----------+-------+  Left Venous Findings: +---------+---------------+---------+-----------+----------+-------+          CompressibilityPhasicitySpontaneityPropertiesSummary +---------+---------------+---------+-----------+----------+-------+ CFV      Full           Yes      Yes                          +---------+---------------+---------+-----------+----------+-------+ SFJ      Full                                                 +---------+---------------+---------+-----------+----------+-------+ FV Prox  Full                                                 +---------+---------------+---------+-----------+----------+-------+ FV Mid   Full                                                  +---------+---------------+---------+-----------+----------+-------+ FV DistalFull                                                 +---------+---------------+---------+-----------+----------+-------+  PFV      Full                                                 +---------+---------------+---------+-----------+----------+-------+ POP      Full           Yes      Yes                          +---------+---------------+---------+-----------+----------+-------+ PTV      Full                                                 +---------+---------------+---------+-----------+----------+-------+ PERO     Full                                                 +---------+---------------+---------+-----------+----------+-------+    Summary: Right: There is no evidence of deep vein thrombosis in the lower extremity. No cystic structure found in the popliteal fossa. Left: There is no evidence of deep vein thrombosis in the lower extremity. No cystic structure found in the popliteal fossa.  *See table(s) above for measurements and observations.    Preliminary     Chart has been reviewed    Assessment/Plan  48 y.o. female with medical history significant of obesity, GERD, HTN, bipolar and anxiety      Admitted for Syncope and AKI in a setting of extreme dieting  Present on Admission: . Dehydration - due to crush dieting, rehydrate and follow fluid status check orthostatics . AKI (acute kidney injury) ([place]) -- likely secondary to dehydration, extreme dieting      check FeNA       Rehydrate with IV fluids      HOLD  ACE/ARBi and nephrotoxic medications  History does not suggest urinary retention or obstruction         If persists despite fluid resuscitation will need renal consult.  . Benign essential HT N - hold home meds given soft blood pressures . Syncope - more of a presyncope in the setting of volume depletions, VQ scan neg, check echo and cardiac markers, check  orthostatics  . Bipolar disorder ([place]) stable continue home medications . Anxiety - stable continue home medications . Depression -stable continue home medications . Obesity, Class III, BMI 40-49.9 (morbid obesity) ([place]) spoke about importance of avoiding crash diets and more making slow changes patient will benefit from nutritional consult . Gastroesophageal reflux disease stable continue home medications    Other plan as per orders.  DVT prophylaxis:   Lovenox     Code Status:  FULL CODE   as per patient   I had personally discussed CODE STATUS with patient and family   Family Communication:   Family  at  Bedside  plan of care was discussed with   mother  Disposition Plan:     To home once workup is complete and patient is stable                     Nutrition  consulted                Consults called: none    Admission status:  Obs    Level of care    tele  For 12H      Emmalyne Giacomo 05/23/2018, 7:35 PM    [facility]     after 2 AM please page floor coverage PA If 7AM-7PM, please contact the day team taking care of the patient using [email or web address]

## 2018-05-23 NOTE — ED Notes (Signed)
Pt was immediately placed on cardiac monitoring when placed in room.

## 2018-05-24 ENCOUNTER — Observation Stay (HOSPITAL_BASED_OUTPATIENT_CLINIC_OR_DEPARTMENT_OTHER): Payer: BLUE CROSS/BLUE SHIELD

## 2018-05-24 DIAGNOSIS — N179 Acute kidney failure, unspecified: Secondary | ICD-10-CM | POA: Diagnosis not present

## 2018-05-24 DIAGNOSIS — R0602 Shortness of breath: Secondary | ICD-10-CM

## 2018-05-24 DIAGNOSIS — I10 Essential (primary) hypertension: Secondary | ICD-10-CM | POA: Diagnosis not present

## 2018-05-24 DIAGNOSIS — E860 Dehydration: Secondary | ICD-10-CM | POA: Diagnosis not present

## 2018-05-24 DIAGNOSIS — K219 Gastro-esophageal reflux disease without esophagitis: Secondary | ICD-10-CM | POA: Diagnosis not present

## 2018-05-24 LAB — COMPREHENSIVE METABOLIC PANEL
ALT: 22 U/L (ref 0–44)
AST: 25 U/L (ref 15–41)
Albumin: 3.5 g/dL (ref 3.5–5.0)
Alkaline Phosphatase: 61 U/L (ref 38–126)
Anion gap: 7 (ref 5–15)
BUN: 25 mg/dL — ABNORMAL HIGH (ref 6–20)
CO2: 17 mmol/L — ABNORMAL LOW (ref 22–32)
Calcium: 8.1 mg/dL — ABNORMAL LOW (ref 8.9–10.3)
Chloride: 111 mmol/L (ref 98–111)
Creatinine, Ser: 1.83 mg/dL — ABNORMAL HIGH (ref 0.44–1.00)
GFR calc Af Amer: 38 mL/min — ABNORMAL LOW (ref >60)
GFR calc non Af Amer: 33 mL/min — ABNORMAL LOW (ref >60)
GLUCOSE: 101 mg/dL — AB (ref 70–99)
Potassium: 3.1 mmol/L — ABNORMAL LOW (ref 3.5–5.1)
Sodium: 135 mmol/L (ref 135–145)
Total Bilirubin: 0.5 mg/dL (ref 0.3–1.2)
Total Protein: 6.6 g/dL (ref 6.5–8.1)

## 2018-05-24 LAB — ECHOCARDIOGRAM COMPLETE
Height: 67 in
Weight: 3792 oz

## 2018-05-24 LAB — CBC
HCT: 36 % (ref 36.0–46.0)
Hemoglobin: 11.5 g/dL — ABNORMAL LOW (ref 12.0–15.0)
MCH: 27.7 pg (ref 26.0–34.0)
MCHC: 31.9 g/dL (ref 30.0–36.0)
MCV: 86.7 fL (ref 80.0–100.0)
Platelets: 254 10*3/uL (ref 150–400)
RBC: 4.15 MIL/uL (ref 3.87–5.11)
RDW: 14.9 % (ref 11.5–15.5)
WBC: 8.7 10*3/uL (ref 4.0–10.5)
nRBC: 0 % (ref 0.0–0.2)

## 2018-05-24 LAB — URINALYSIS, ROUTINE W REFLEX MICROSCOPIC
BILIRUBIN URINE: NEGATIVE
Glucose, UA: NEGATIVE mg/dL
Ketones, ur: NEGATIVE mg/dL
Leukocytes,Ua: NEGATIVE
Nitrite: NEGATIVE
Protein, ur: NEGATIVE mg/dL
Specific Gravity, Urine: 1.014 (ref 1.005–1.030)
pH: 5 (ref 5.0–8.0)

## 2018-05-24 LAB — BASIC METABOLIC PANEL
Anion gap: 7 (ref 5–15)
BUN: 19 mg/dL (ref 6–20)
CO2: 17 mmol/L — AB (ref 22–32)
Calcium: 8.3 mg/dL — ABNORMAL LOW (ref 8.9–10.3)
Chloride: 111 mmol/L (ref 98–111)
Creatinine, Ser: 1.18 mg/dL — ABNORMAL HIGH (ref 0.44–1.00)
GFR calc Af Amer: >60 mL/min (ref >60)
GFR calc non Af Amer: 56 mL/min — ABNORMAL LOW (ref >60)
Glucose, Bld: 96 mg/dL (ref 70–99)
Potassium: 3.8 mmol/L (ref 3.5–5.1)
SODIUM: 135 mmol/L (ref 135–145)

## 2018-05-24 LAB — TSH: TSH: 0.529 u[IU]/mL (ref 0.350–4.500)

## 2018-05-24 LAB — TROPONIN I: Troponin I: <0.03 ng/mL (ref <0.03)

## 2018-05-24 LAB — MAGNESIUM: Magnesium: 2.4 mg/dL (ref 1.7–2.4)

## 2018-05-24 LAB — PHOSPHORUS: PHOSPHORUS: 3.3 mg/dL (ref 2.5–4.6)

## 2018-05-24 LAB — HIV ANTIBODY (ROUTINE TESTING W REFLEX): HIV Screen 4th Generation wRfx: NONREACTIVE

## 2018-05-24 MED ORDER — POTASSIUM CHLORIDE CRYS ER 20 MEQ PO TBCR
40.0000 meq | EXTENDED_RELEASE_TABLET | Freq: Two times a day (BID) | ORAL | Status: DC
  Administered 2018-05-24: 40 meq via ORAL
  Filled 2018-05-24: qty 2

## 2018-05-24 NOTE — Progress Notes (Signed)
Discharge teaching completed with teach back. No prescriptions given. Pt. understands to call Monday and follow up with PCP for lab work. Discharge handout given and reviewed with pt. Pt. left via ambulation, discharged to home. No respiratory distress noted.

## 2018-05-24 NOTE — Discharge Summary (Signed)
Physician Discharge Summary  Shelby Howard IPJ:[id number] DOB: [birth year] DOA: 05/23/2018  PCP: Audley Hose, MD  Admit date: 05/23/2018 Discharge date: 05/24/2018  Admitted From: Home.  Disposition:  Home.   Recommendations for Outpatient Follow-up:  1. Follow up with PCP in 1-2 weeks 2. Please obtain BMP/CBC in one week     Discharge Condition: stable.  CODE STATUS:full code.  Diet recommendation: Heart Healthy   Brief/Interim Summary: Shelby Howard is a 48 y.o. female with medical history significant of obesity, GERD, HTN, bipolar and anxiety Presented with sudden onset of shortness of breath, lightheadedness and near syncope. She was admitted for evaluation of the above, alsong with AKI and dehydration.   Discharge Diagnoses:  Active Problems:   Bipolar disorder (HCC)   Anxiety   Depression   Obesity, Class III, BMI 40-49.9 (morbid obesity) (HCC)   Gastroesophageal reflux disease   Benign essential HTN   AKI (acute kidney injury) (HCC)   Syncope   Dehydration   Lightheadedness, near syncope probably secondary to hypotension, dehydration and AKI from decreased po intake and severe dieting.  All her symptoms resolved with IV fluids and holding the bp medications.  Echo was wnl.  Orthostatics negative.  Recommended to hold the hydrochlorothiazide and lisinopril for one week and follow up with PCP before restarting the medications.  Repeat creatinine shows much improvement with hydration.     Hypertension:  Well controlled    Bipolar disorder Stable.    Anxiety; Stable.    GERD  STABLE.   Discharge Instructions  Discharge Instructions    Diet - low sodium heart healthy   Complete by:  As directed    Discharge instructions   Complete by:  As directed    Please follow up with PCP on Monday and check CBC and BMP.     Allergies as of 05/24/2018   No Known Allergies     Medication List    STOP taking these medications   ciprofloxacin 500 MG  tablet Commonly known as:  CIPRO   dicyclomine 20 MG tablet Commonly known as:  BENTYL   gabapentin 100 MG capsule Commonly known as:  NEURONTIN   hydrALAZINE 25 MG tablet Commonly known as:  APRESOLINE   hydrochlorothiazide 25 MG tablet Commonly known as:  HYDRODIURIL   lisinopril 5 MG tablet Commonly known as:  PRINIVIL,ZESTRIL   metroNIDAZOLE 500 MG tablet Commonly known as:  FLAGYL   ondansetron 4 MG disintegrating tablet Commonly known as:  ZOFRAN ODT   ondansetron 4 MG tablet Commonly known as:  ZOFRAN   pantoprazole 40 MG tablet Commonly known as:  PROTONIX     TAKE these medications   ALPRAZolam 1 MG tablet Commonly known as:  XANAX Take 1 mg by mouth 2 (two) times daily as needed for anxiety.   aspirin 325 MG tablet Take 650 mg by mouth daily.   citalopram 40 MG tablet Commonly known as:  CELEXA Take 40 mg by mouth daily.   HYDROcodone-acetaminophen 5-325 MG tablet Commonly known as:  NORCO/VICODIN Take 1-2 tablets by mouth every 6 (six) hours as needed.   lurasidone 40 MG Tabs tablet Commonly known as:  LATUDA Take 1 tablet (40 mg total) by mouth daily with breakfast. For mood control What changed:  when to take this   multivitamin tablet Take 1 tablet by mouth daily.   potassium chloride 10 MEQ tablet Commonly known as:  K-DUR,KLOR-CON Take 10 mEq by mouth daily.   topiramate 50 MG tablet Commonly known  as:  TOPAMAX Take 100 mg by mouth at bedtime.   zaleplon 5 MG capsule Commonly known as:  SONATA 5 mg at bedtime as needed for sleep.   zolpidem 10 MG tablet Commonly known as:  AMBIEN Take 1 tablet (10 mg total) by mouth at bedtime as needed for sleep.      Follow-up Information    Audley Hose, MD.   Specialty:  Internal Medicine Contact information: [place] [address] [phone]          No Known Allergies  Consultations:  NONE.    Procedures/Studies: Dg Chest 2  View  Result Date: 05/23/2018 CLINICAL DATA:  Patient had a sudden onset of SOB, dizziness and an elevated heart rate at work >120, nonsmoker, no other chest complaints EXAM: CHEST - 2 VIEW COMPARISON:  11/04/2014 FINDINGS: The heart size and mediastinal contours are within normal limits. Both lungs are clear. No pleural effusion or pneumothorax. The visualized skeletal structures are unremarkable. IMPRESSION: Normal chest radiographs. Electronically Signed   By: Lajean Manes M.D.   On: 05/23/2018 13:28   Nm Pulmonary Vent And Perf (v/q Scan)  Result Date: 05/23/2018 CLINICAL DATA:  Shortness of breath and renal insufficiency. EXAM: NUCLEAR MEDICINE VENTILATION - PERFUSION LUNG SCAN TECHNIQUE: Ventilation images were obtained in multiple projections using inhaled aerosol Tc-85m DTPA. Perfusion images were obtained in multiple projections after intravenous injection of Tc-32m MAA. RADIOPHARMACEUTICALS:  33 mCi of Tc-78m DTPA aerosol inhalation and 4.3 mCi Tc30m MAA IV COMPARISON:  None. FINDINGS: Ventilation: Ventilatory imaging demonstrates normal distribution of DTPA aerosol throughout both lungs without significant ventilatory defect. Perfusion: Perfusion imaging is normal with no perfusion defects identified in either lung. IMPRESSION: Normal nuclear medicine ventilation and perfusion scintigraphy demonstrating no evidence of pulmonary embolism. Electronically Signed   By: Aletta Edouard M.D.   On: 05/23/2018 17:36   Vas [place] Lower Extremity Venous (dvt) (only [facility])  Result Date: 05/24/2018  Lower Venous Study Indications: Swelling, and Edema.  Performing Technologist: [name] RVS  Examination Guidelines: A complete evaluation includes B-mode imaging, spectral Doppler, color Doppler, and power Doppler as needed of all accessible portions of each vessel. Bilateral testing is considered an integral part of a complete examination. Limited examinations for reoccurring indications may be performed as  noted.  Right Venous Findings: +---------+---------------+---------+-----------+----------+-------+          CompressibilityPhasicitySpontaneityPropertiesSummary +---------+---------------+---------+-----------+----------+-------+ CFV      Full           Yes      Yes                          +---------+---------------+---------+-----------+----------+-------+ SFJ      Full                                                 +---------+---------------+---------+-----------+----------+-------+ FV Prox  Full                                                 +---------+---------------+---------+-----------+----------+-------+ FV Mid   Full                                                 +---------+---------------+---------+-----------+----------+-------+  FV DistalFull                                                 +---------+---------------+---------+-----------+----------+-------+ PFV      Full                                                 +---------+---------------+---------+-----------+----------+-------+ POP      Full           Yes      Yes                          +---------+---------------+---------+-----------+----------+-------+ PTV      Full                                                 +---------+---------------+---------+-----------+----------+-------+ PERO     Full                                                 +---------+---------------+---------+-----------+----------+-------+  Left Venous Findings: +---------+---------------+---------+-----------+----------+-------+          CompressibilityPhasicitySpontaneityPropertiesSummary +---------+---------------+---------+-----------+----------+-------+ CFV      Full           Yes      Yes                          +---------+---------------+---------+-----------+----------+-------+ SFJ      Full                                                  +---------+---------------+---------+-----------+----------+-------+ FV Prox  Full                                                 +---------+---------------+---------+-----------+----------+-------+ FV Mid   Full                                                 +---------+---------------+---------+-----------+----------+-------+ FV DistalFull                                                 +---------+---------------+---------+-----------+----------+-------+ PFV      Full                                                 +---------+---------------+---------+-----------+----------+-------+  POP      Full           Yes      Yes                          +---------+---------------+---------+-----------+----------+-------+ PTV      Full                                                 +---------+---------------+---------+-----------+----------+-------+ PERO     Full                                                 +---------+---------------+---------+-----------+----------+-------+    Summary: Right: There is no evidence of deep vein thrombosis in the lower extremity. No cystic structure found in the popliteal fossa. Left: There is no evidence of deep vein thrombosis in the lower extremity. No cystic structure found in the popliteal fossa.  *See table(s) above for measurements and observations. Electronically signed by Servando Snare MD on 05/24/2018 at 1:25:14 PM.    Final       Subjective: No new complaints, adamant about going home today.   Discharge Exam: Vitals:   05/24/18 0632 05/24/18 1225  BP: 103/61 111/73  Pulse: 80 68  Resp:  18  Temp: 98.2 F (36.8 C) 98.1 F (36.7 C)  SpO2: 97% 100%   Vitals:   05/23/18 1930 05/23/18 2020 05/24/18 0632 05/24/18 1225  BP: 127/68 128/72 103/61 111/73  Pulse: 86 (!) 59 80 68  Resp: 17   18  Temp:  97.9 F (36.6 C) 98.2 F (36.8 C) 98.1 F (36.7 C)  TempSrc:  Oral Oral Oral  SpO2: 100% 100% 97% 100%  Weight:       Height:        General: Pt is alert, awake, not in acute distress Cardiovascular: RRR, S1/S2 +, no rubs, no gallops Respiratory: CTA bilaterally, no wheezing, no rhonchi Abdominal: Soft, NT, ND, bowel sounds + Extremities: no edema, no cyanosis    The results of significant diagnostics from this hospitalization (including imaging, microbiology, ancillary and laboratory) are listed below for reference.     Microbiology: No results found for this or any previous visit (from the past 240 hour(s)).   Labs: BNP (last 3 results) Recent Labs    05/23/18 2142  BNP 96.2   Basic Metabolic Panel: Recent Labs  Lab 05/23/18 1335 05/23/18 2055 05/24/18 0025 05/24/18 1119  NA 134* 136 135 135  K 3.8 3.5 3.1* 3.8  CL 105 110 111 111  CO2 21* 14* 17* 17*  GLUCOSE 90 83 101* 96  BUN 22* 24* 25* 19  CREATININE 2.34* 2.07* 1.83* 1.18*  CALCIUM 8.8* 8.4* 8.1* 8.3*  MG  --  2.5* 2.4  --   PHOS  --  3.2 3.3  --    Liver Function Tests: Recent Labs  Lab 05/23/18 1335 05/24/18 0025  AST 19 25  ALT 18 22  ALKPHOS 62 61  BILITOT 0.5 0.5  PROT 7.4 6.6  ALBUMIN 4.3 3.5   No results for input(s): LIPASE, AMYLASE in the last 168 hours. No results for input(s): AMMONIA in the last 168 hours. CBC: Recent Labs  Lab 05/23/18 1335 05/23/18 2142 05/24/18 0025  WBC 11.4* 9.4 8.7  NEUTROABS  --  4.8  --   HGB 12.9 12.7 11.5*  HCT 40.5 40.3 36.0  MCV 86.5 88.6 86.7  PLT 265 239 254   Cardiac Enzymes: Recent Labs  Lab 05/23/18 1335 05/23/18 1843 05/24/18 0025 05/24/18 0653  CKTOTAL  --  85  --   --   TROPONINI <0.03 <0.03 <0.03 <0.03   BNP: Invalid input(s): POCBNP CBG: Recent Labs  Lab 05/23/18 1241  GLUCAP 78   D-Dimer Recent Labs    05/23/18 1335  DDIMER 3.05*   Hgb A1c No results for input(s): HGBA1C in the last 72 hours. Lipid Profile No results for input(s): CHOL, HDL, LDLCALC, TRIG, CHOLHDL, LDLDIRECT in the last 72 hours. Thyroid function  studies Recent Labs    05/24/18 0025  TSH 0.529   Anemia work up No results for input(s): VITAMINB12, FOLATE, FERRITIN, TIBC, IRON, RETICCTPCT in the last 72 hours. Urinalysis    Component Value Date/Time   COLORURINE YELLOW 05/24/2018 0108   APPEARANCEUR HAZY (A) 05/24/2018 0108   LABSPEC 1.014 05/24/2018 0108   PHURINE 5.0 05/24/2018 0108   GLUCOSEU NEGATIVE 05/24/2018 0108   HGBUR SMALL (A) 05/24/2018 0108   BILIRUBINUR NEGATIVE 05/24/2018 0108   BILIRUBINUR NEG 09/14/2011 0845   KETONESUR NEGATIVE 05/24/2018 0108   PROTEINUR NEGATIVE 05/24/2018 0108   UROBILINOGEN negative 09/14/2011 0845   UROBILINOGEN 0.2 03/17/2007 2036   NITRITE NEGATIVE 05/24/2018 0108   LEUKOCYTESUR NEGATIVE 05/24/2018 0108   Sepsis Labs Invalid input(s): PROCALCITONIN,  WBC,  LACTICIDVEN Microbiology No results found for this or any previous visit (from the past 240 hour(s)).   Time coordinating discharge: 31 minutes  SIGNED:   Hosie Poisson, MD  [facility] 05/24/2018, 1:26 PM Pager   If 7PM-7AM, please contact night-coverage [email or web address] Password [email or web address]

## 2018-05-24 NOTE — Progress Notes (Signed)
  Echocardiogram 2D Echocardiogram has been performed.  Shelby Howard 05/24/2018, 9:36 AM

## 2019-11-15 ENCOUNTER — Encounter (HOSPITAL_COMMUNITY): Payer: Self-pay | Admitting: Emergency Medicine

## 2019-11-15 ENCOUNTER — Emergency Department (HOSPITAL_COMMUNITY)
Admission: EM | Admit: 2019-11-15 | Discharge: 2019-11-15 | Disposition: A | Discharge status: Home / Self Care | Payer: Self-pay | Attending: Emergency Medicine | Admitting: Emergency Medicine

## 2019-11-15 ENCOUNTER — Other Ambulatory Visit: Payer: Self-pay

## 2019-11-15 ENCOUNTER — Ambulatory Visit (HOSPITAL_COMMUNITY)
Admission: EM | Admit: 2019-11-15 | Discharge: 2019-11-15 | Disposition: A | Discharge status: Home / Self Care | Payer: BLUE CROSS/BLUE SHIELD | Attending: Family Medicine | Admitting: Family Medicine

## 2019-11-15 ENCOUNTER — Emergency Department (HOSPITAL_COMMUNITY): Payer: Self-pay

## 2019-11-15 DIAGNOSIS — S01511A Laceration without foreign body of lip, initial encounter: Secondary | ICD-10-CM | POA: Insufficient documentation

## 2019-11-15 DIAGNOSIS — Y929 Unspecified place or not applicable: Secondary | ICD-10-CM | POA: Insufficient documentation

## 2019-11-15 DIAGNOSIS — Y9389 Activity, other specified: Secondary | ICD-10-CM | POA: Insufficient documentation

## 2019-11-15 DIAGNOSIS — Y9289 Other specified places as the place of occurrence of the external cause: Secondary | ICD-10-CM | POA: Insufficient documentation

## 2019-11-15 DIAGNOSIS — Y999 Unspecified external cause status: Secondary | ICD-10-CM | POA: Insufficient documentation

## 2019-11-15 DIAGNOSIS — W19XXXA Unspecified fall, initial encounter: Secondary | ICD-10-CM | POA: Insufficient documentation

## 2019-11-15 DIAGNOSIS — S022XXA Fracture of nasal bones, initial encounter for closed fracture: Secondary | ICD-10-CM | POA: Insufficient documentation

## 2019-11-15 DIAGNOSIS — I10 Essential (primary) hypertension: Secondary | ICD-10-CM | POA: Insufficient documentation

## 2019-11-15 DIAGNOSIS — Z79899 Other long term (current) drug therapy: Secondary | ICD-10-CM | POA: Insufficient documentation

## 2019-11-15 MED ORDER — LIDOCAINE-EPINEPHRINE 1 %-1:100000 IJ SOLN
10.0000 mL | Freq: Once | INTRAMUSCULAR | Status: DC
  Filled 2019-11-15: qty 1

## 2019-11-15 NOTE — ED Triage Notes (Signed)
Patient reports she slipped and fell earlier today landing face forward. Patient presents with swelling to bridge of nose, abrasions to nose/face, and laceration to top lip. Denies LOC. Denies neck pain. No thinners.

## 2019-11-15 NOTE — ED Provider Notes (Signed)
[place] EMERGENCY DEPARTMENT Provider Note   CSN: [id number] Arrival date & time: 11/15/19  1429     History Chief Complaint  Patient presents with  . Fall    Shelby Howard is a 49 y.o. female.  49 yo F with a chief complaints of a fall.  Patient was wearing a different pair shoes and she typically tries to walk get her mail and she lost her balance and fell onto her face.  She has a cut to her upper lip.  She is having mild headache.  Denies other injury.  Has a chipped tooth that she needs to see the dentist for.  The history is provided by the patient.  Fall This is a new problem. The current episode started 2 days ago. The problem occurs constantly. The problem has not changed since onset.Pertinent negatives include no chest pain, no abdominal pain, no headaches and no shortness of breath. Nothing aggravates the symptoms. Nothing relieves the symptoms. She has tried nothing for the symptoms. The treatment provided no relief.       Past Medical History:  Diagnosis Date  . Anemia    history  . Anxiety   . Bipolar disorder ([place])   . Depression   . 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 Fibroids   . Hypertension   . Insomnia     Patient Active Problem List   Diagnosis Date Noted  . AKI (acute kidney injury) ([place]) 05/23/2018  . Syncope 05/23/2018  . Dehydration 05/23/2018  . Obesity, Class III, BMI 40-49.9 (morbid obesity) ([place])   . Gastroesophageal reflux disease   . Benign essential HTN   . Hypokalemia   . Intractable nausea and vomiting 01/20/2017  . Alcohol use disorder, mild, abuse 11/12/2015  . Bipolar I disorder, most recent episode depressed ([place]) 11/12/2015  . Fibroids   . Bipolar disorder ([place])   . Anxiety   . Depression   . Insomnia   . 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 ABDOMINAL HYSTERECTOMY    . CERVICAL CONIZATION  W/BX  03/02/2011   Procedure: CONIZATION CERVIX WITH BIOPSY;  Surgeon: Betsy Coder, MD;  Location: [facility];  Service: Gynecology;  Laterality: N/A;  Cold Knife Conization, Endocervical Curretage  . CYSTOSCOPY  09/06/2011   Procedure: CYSTOSCOPY;  Surgeon: Betsy Coder, MD;  Location: [facility];  Service: Gynecology;  Laterality: N/A;  . DILATION AND CURETTAGE OF UTERUS  09/2007   resection of polyp  . LAPAROSCOPIC HYSTERECTOMY  09/06/2011   Procedure: HYSTERECTOMY TOTAL LAPAROSCOPIC;  Surgeon: Betsy Coder, MD;  Location: [facility];  Service: Gynecology;  Laterality: N/A;  . SVD  1998   x 1     OB History    Gravida  1   Para  1   Term      Preterm      AB      Living  1     SAB      TAB      Ectopic      Multiple      Live Births              Family History  Problem Relation Age of Onset  . Hypertension Mother   . Bipolar disorder Mother   . Bipolar disorder Sister     Social History   Tobacco Use  . Smoking status:  Never Smoker  . Smokeless tobacco: Never Used  Vaping Use  . Vaping Use: Never used  Substance Use Topics  . Alcohol use: Not Currently    Alcohol/week: 4.0 standard drinks    Types: 4 Glasses of wine per week  . Drug use: No    Home Medications Prior to Admission medications   Medication Sig Start Date End Date Taking? Authorizing Provider  ALPRAZolam Duanne Moron) 1 MG tablet Take 1 mg by mouth 2 (two) times daily as needed for anxiety. 05/30/16   [provider]  aspirin 325 MG tablet Take 650 mg by mouth daily.    [provider]  citalopram (CELEXA) 40 MG tablet Take 40 mg by mouth daily. 04/02/18   [provider]  HYDROcodone-acetaminophen (NORCO/VICODIN) 5-325 MG tablet Take 1-2 tablets by mouth every 6 (six) hours as needed. Patient not taking: Reported on 05/23/2018 10/02/17   Frederica Kuster, PA-C  lurasidone (LATUDA) 40 MG TABS tablet Take 1 tablet (40 mg total) by mouth daily with breakfast. For mood  control Patient taking differently: Take 40 mg by mouth at bedtime. For mood control 11/15/15   Lindell Spar I, NP  Multiple Vitamin (MULTIVITAMIN) tablet Take 1 tablet by mouth daily.    [provider]  potassium chloride (K-DUR,KLOR-CON) 10 MEQ tablet Take 10 mEq by mouth daily.    [provider]  topiramate (TOPAMAX) 50 MG tablet Take 100 mg by mouth at bedtime.  02/26/18   [provider]  zaleplon (SONATA) 5 MG capsule 5 mg at bedtime as needed for sleep.  08/20/17   [provider]  zolpidem (AMBIEN) 10 MG tablet Take 1 tablet (10 mg total) by mouth at bedtime as needed for sleep. 11/15/15   Encarnacion Slates, NP    Allergies    Patient has no known allergies.  Review of Systems   Review of Systems  Constitutional: Negative for chills and fever.  HENT: Negative for congestion and rhinorrhea.   Eyes: Negative for redness and visual disturbance.  Respiratory: Negative for shortness of breath and wheezing.   Cardiovascular: Negative for chest pain and palpitations.  Gastrointestinal: Negative for abdominal pain, nausea and vomiting.  Genitourinary: Negative for dysuria and urgency.  Musculoskeletal: Negative for arthralgias and myalgias.  Skin: Positive for wound. Negative for pallor.  Neurological: Negative for dizziness and headaches.    Physical Exam Updated Vital Signs BP 138/73 (BP Location: Right Arm)   Pulse (!) 106   Temp 97.8 F (36.6 C) (Oral)   Resp 19   Ht 5\' 7"  (1.702 m)   Wt 113.4 kg   LMP 08/07/2011   SpO2 98%   BMI 39.16 kg/m   Physical Exam Vitals and nursing note reviewed.  Constitutional:      General: She is not in acute distress.    Appearance: She is well-developed. She is not diaphoretic.  HENT:     Head: Normocephalic and atraumatic.      Comments: No nasal septal hematoma. Eyes:     Pupils: Pupils are equal, round, and reactive to light.  Cardiovascular:     Rate and Rhythm: Normal rate and regular rhythm.       Heart sounds: No murmur heard.  No friction rub. No gallop.   Pulmonary:     Effort: Pulmonary effort is normal.     Breath sounds: No wheezing or rales.  Abdominal:     General: There is no distension.     Palpations: Abdomen is  soft.     Tenderness: There is no abdominal tenderness.  Musculoskeletal:        General: No tenderness.     Cervical back: Normal range of motion and neck supple.  Skin:    General: Skin is warm and dry.  Neurological:     Mental Status: She is alert and oriented to person, place, and time.  Psychiatric:        Behavior: Behavior normal.     ED Results / Procedures / Treatments   Labs (all labs ordered are listed, but only abnormal results are displayed) Labs Reviewed - No data to display  EKG None  Radiology CT Head Wo Contrast  Result Date: 11/15/2019 CLINICAL DATA:  Tripped and fell, facial laceration EXAM: CT HEAD WITHOUT CONTRAST TECHNIQUE: Contiguous axial images were obtained from the base of the skull through the vertex without intravenous contrast. COMPARISON:  None. FINDINGS: Brain: No acute infarct or hemorrhage. Lateral ventricles and midline structures are unremarkable. No acute extra-axial fluid collections. No mass effect. Vascular: No hyperdense vessel or unexpected calcification. Skull: Normal. Negative for fracture or focal lesion. Sinuses/Orbits: No acute finding. Other: None. IMPRESSION: 1. No acute intracranial process. Electronically Signed   By: Randa Ngo M.D.   On: 11/15/2019 16:52   CT Maxillofacial Wo Contrast  Result Date: 11/15/2019 CLINICAL DATA:  Tripped and fell, facial laceration EXAM: CT MAXILLOFACIAL WITHOUT CONTRAST TECHNIQUE: Multidetector CT imaging of the maxillofacial structures was performed. Multiplanar CT image reconstructions were also generated. COMPARISON:  None. FINDINGS: Osseous: There is a small displaced fracture at the tip of the nasal bone. No other acute facial bone fractures. Orbits:  Negative. No traumatic or inflammatory finding. Sinuses: Clear. Soft tissues: Soft tissue swelling and loop small laceration overlying the nasal bridge. Remaining soft tissues are unremarkable. Limited intracranial: No significant or unexpected finding. IMPRESSION: 1. Minimally displaced fracture at the tip of the nasal bone, with overlying soft tissue swelling and small laceration. Electronically Signed   By: Randa Ngo M.D.   On: 11/15/2019 16:54    Procedures .Marland KitchenLaceration Repair  Date/Time: 11/15/2019 9:17 PM Performed by: Deno Etienne, DO Authorized by: Deno Etienne, DO   Consent:    Consent obtained:  Verbal   Consent given by:  Patient   Risks discussed:  Infection, pain, poor cosmetic result and poor wound healing   Alternatives discussed:  No treatment and delayed treatment Anesthesia (see MAR for exact dosages):    Anesthesia method:  Local infiltration   Local anesthetic:  Lidocaine 2% WITH epi Laceration details:    Location:  Lip   Lip location:  Upper exterior lip and upper interior lip   Length (cm):  2.8 Repair type:    Repair type:  Simple Pre-procedure details:    Preparation:  Patient was prepped and draped in usual sterile fashion Exploration:    Hemostasis achieved with:  Epinephrine   Wound exploration: wound explored through full range of motion and entire depth of wound probed and visualized     Wound extent: no vascular damage noted     Contaminated: no   Treatment:    Area cleansed with:  Saline   Amount of cleaning:  Standard   Irrigation solution:  Sterile saline   Irrigation volume:  50   Irrigation method:  Syringe   Visualized foreign bodies/material removed: no   Skin repair:    Repair method:  Sutures   Suture size:  5-0   Suture material:  Fast-absorbing gut  Suture technique:  Simple interrupted   Number of sutures:  5 Approximation:    Approximation:  Close   Vermilion border: well-aligned   Post-procedure details:    Dressing:  Open  (no dressing)   Patient tolerance of procedure:  Tolerated well, no immediate complications   (including critical care time)  Medications Ordered in ED Medications  lidocaine-EPINEPHrine (XYLOCAINE W/EPI) 1 %-1:100000 (with pres) injection 10 mL (has no administration in time range)    ED Course  I have reviewed the triage vital signs and the nursing notes.  Pertinent labs & imaging results that were available during my care of the patient were reviewed by me and considered in my medical decision making (see chart for details).    MDM Rules/Calculators/A&P                          49 yo F with a cc of a fall.  Non syncopal by history.  Abrasion to face with a likely avulsion injury to the upper lip.  Will suture at bedside, ENT and PCP follow up.   9:18 PM:  I have discussed the diagnosis/risks/treatment options with the patient and believe the pt to be eligible for discharge home to follow-up with PCP. We also discussed returning to the ED immediately if new or worsening sx occur. We discussed the sx which are most concerning (e.g., sudden worsening pain, fever, inability to tolerate by mouth) that necessitate immediate return. Medications administered to the patient during their visit and any new prescriptions provided to the patient are listed below.  Medications given during this visit Medications  lidocaine-EPINEPHrine (XYLOCAINE W/EPI) 1 %-1:100000 (with pres) injection 10 mL (has no administration in time range)     The patient appears reasonably screen and/or stabilized for discharge and I doubt any other medical condition or other [facility] requiring further screening, evaluation, or treatment in the ED at this time prior to discharge.    Final Clinical Impression(s) / ED Diagnoses Final diagnoses:  Closed fracture of nasal bone, initial encounter  Lip laceration, initial encounter    Rx / DC Orders ED Discharge Orders    None       Deno Etienne, DO 11/15/19 2118

## 2019-11-15 NOTE — ED Notes (Signed)
E-signature pad unavailable at time of pt discharge. This RN discussed discharge materials with pt and answered all pt questions. Pt stated understanding of discharge material. ? ?

## 2019-11-15 NOTE — Discharge Instructions (Addendum)
Your stitches should dissolve between day 3 and 5.  If there is still there after the fifth day he can gently plucked them out with tweezers.  Please follow-up with your family doctor in the office.  We are usually concerned for infection if the area becomes red you have drainage or develop a fever please have someone see you sooner.  I have attached information for the ear nose and throat doctor that is on-call.  Sometimes if your nasal fracture is displaced they will reduce it in the office in about a week.  You can let water run over your stitches but do not scrub it.  No full underwater immersion until day 5.  If you want to clean and she can use a half water and half hydrogen peroxide solution on a Q-tip.

## 2021-03-15 IMAGING — CT CT MAXILLOFACIAL W/O CM
3 series · 16 of 47 positions shown, 19 images · non-contrast
Comparison: None.

CLINICAL DATA: Tripped and fell, facial laceration

EXAM:
CT MAXILLOFACIAL WITHOUT CONTRAST
TECHNIQUE: Multidetector CT imaging of the maxillofacial structures was
performed. Multiplanar CT image reconstructions were also generated.

[Series 3: facialbone 2.0 st · axial · 0.34mm/px · z∈[-238,-96]mm · 10 of 83 slices shown, 13 images]
[im 6/83  brain]
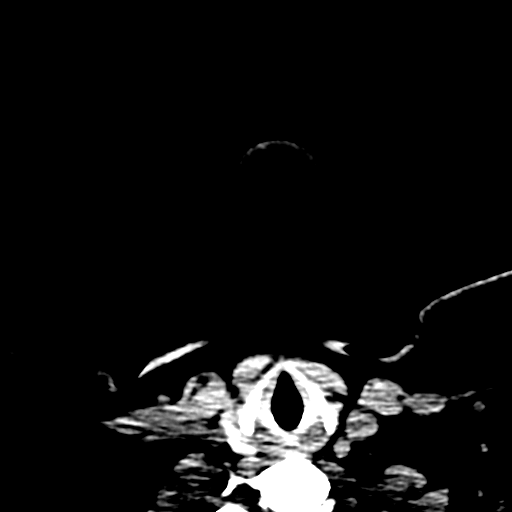
[im 6/83  bone]
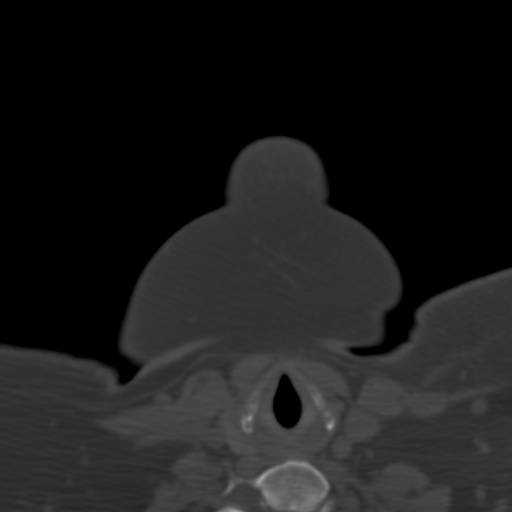
[im 15/83  bone]
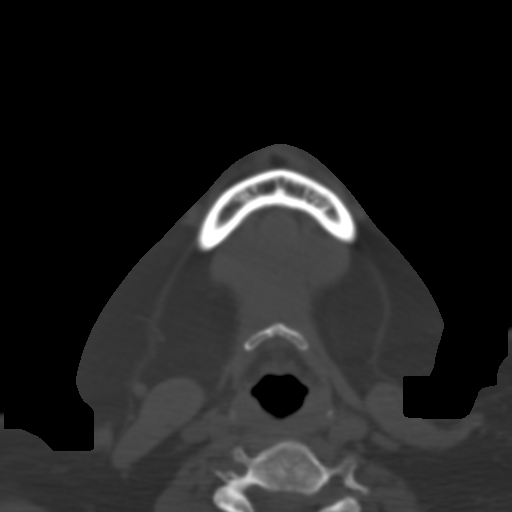
[im 23/83  bone]
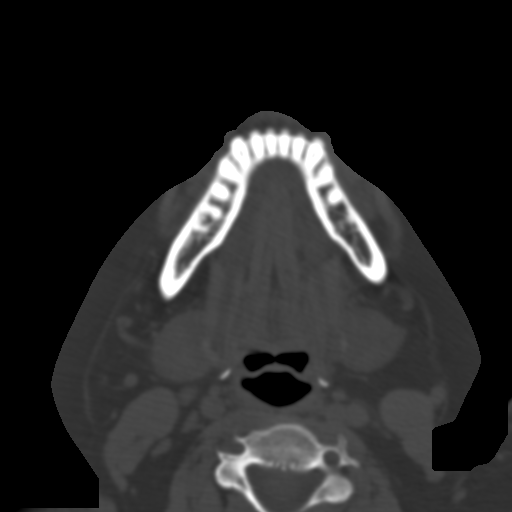
[im 29/83  bone]
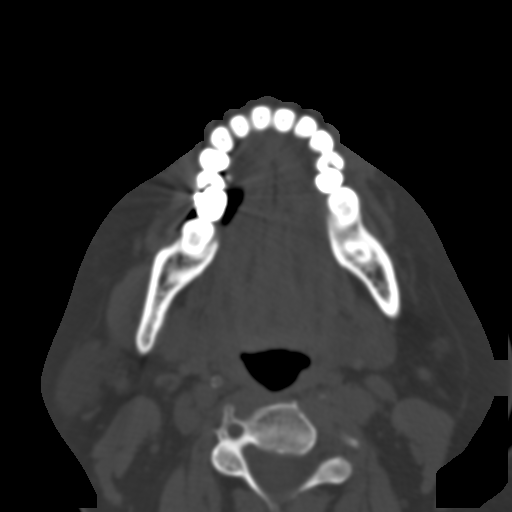
[im 37/83  brain]
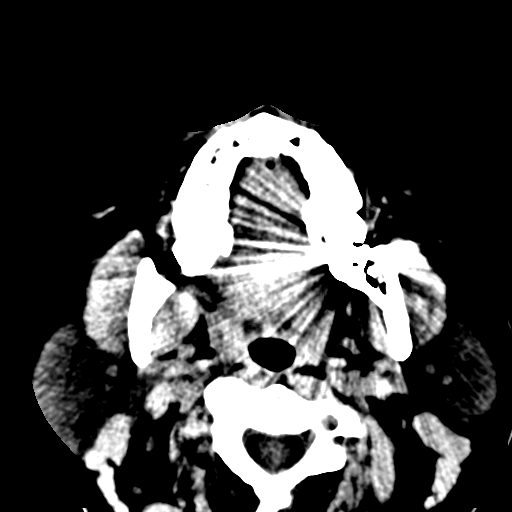
[im 37/83  bone]
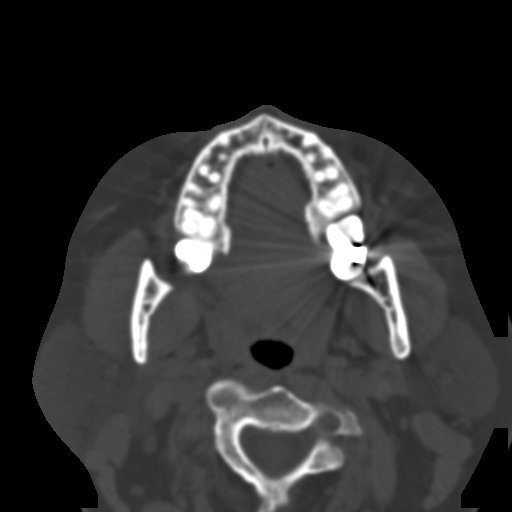
[im 46/83  bone]
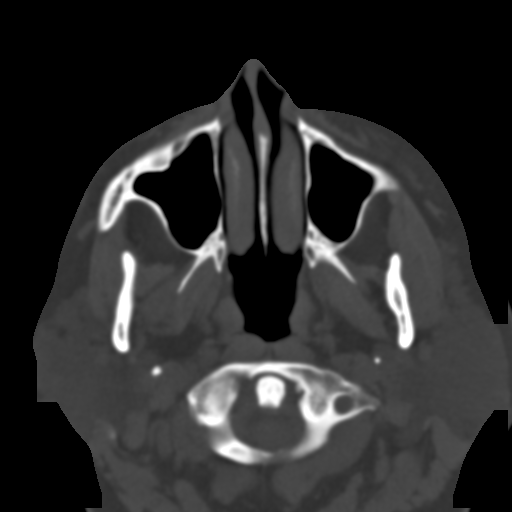
[im 54/83  bone]
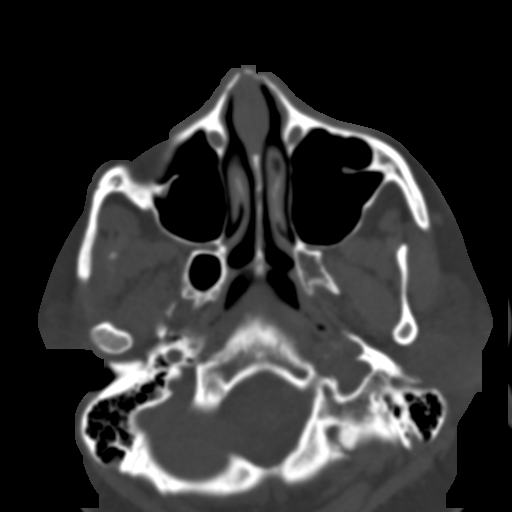
[im 63/83  bone]
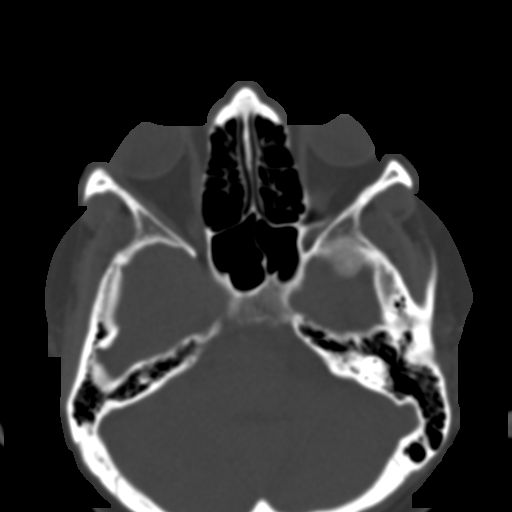
[im 68/83  brain]
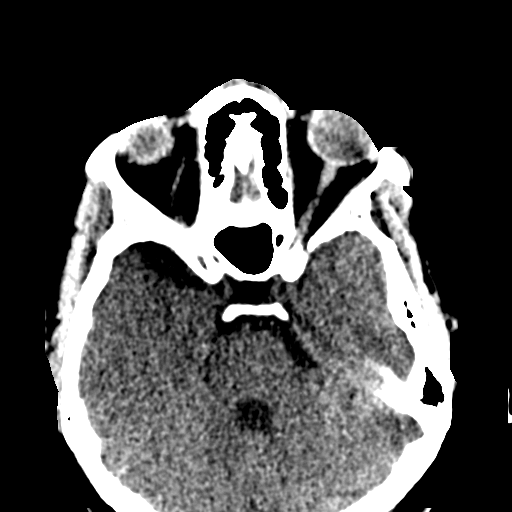
[im 68/83  bone]
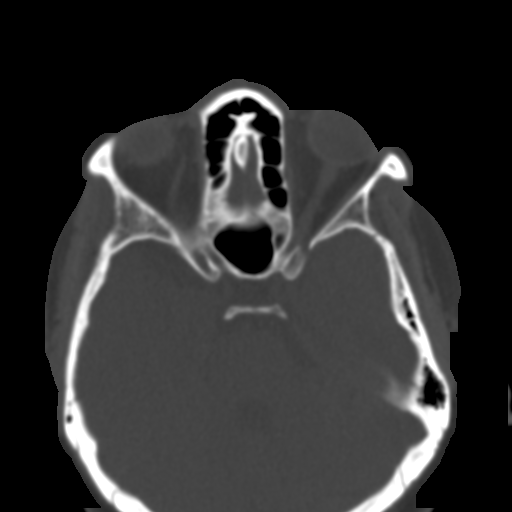
[im 77/83  bone]
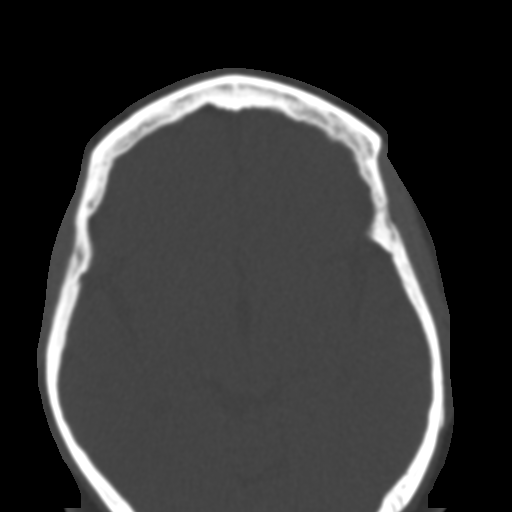

[Series 7: facialbone 2.0 cor st · coronal · 0.36mm/px · 3 of 75 slices shown]
[im 25/75  bone]
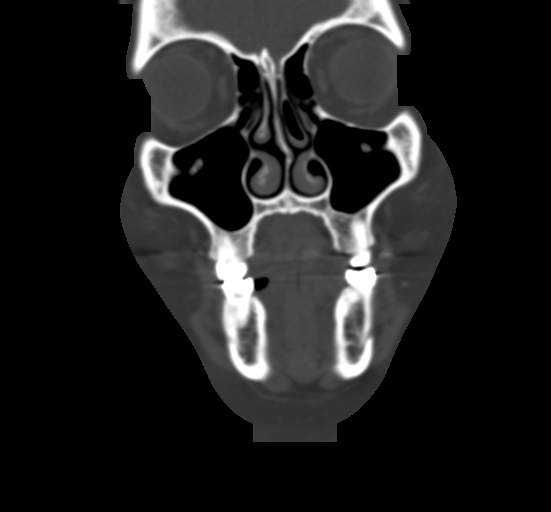
[im 33/75  bone]
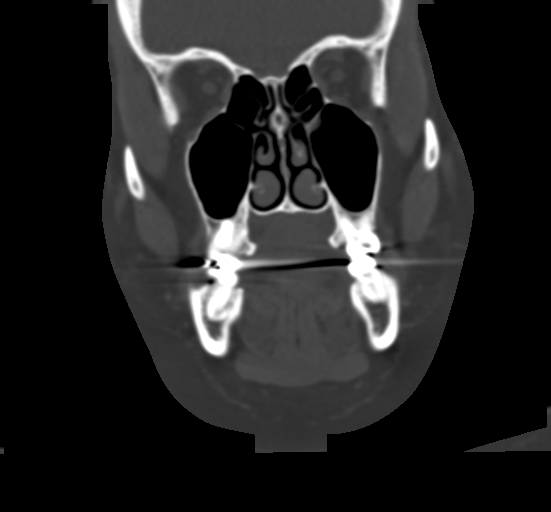
[im 42/75  bone]
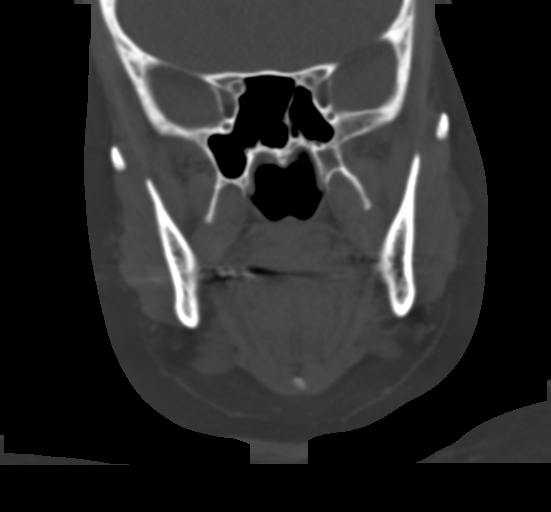

[Series 8: facialbone 2.0 sag st · sagittal · 0.36mm/px · 3 of 75 slices shown]
[im 25/75  bone]
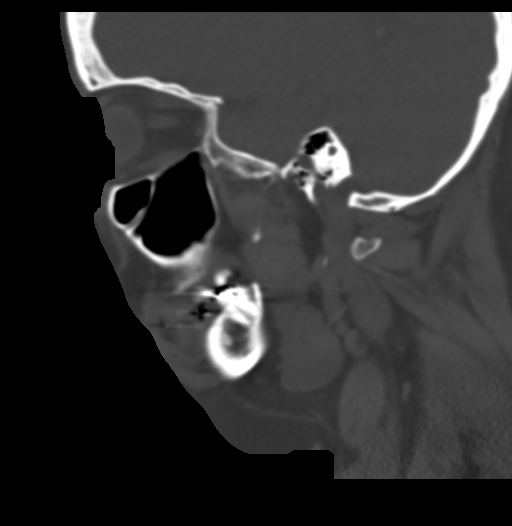
[im 38/75  bone]
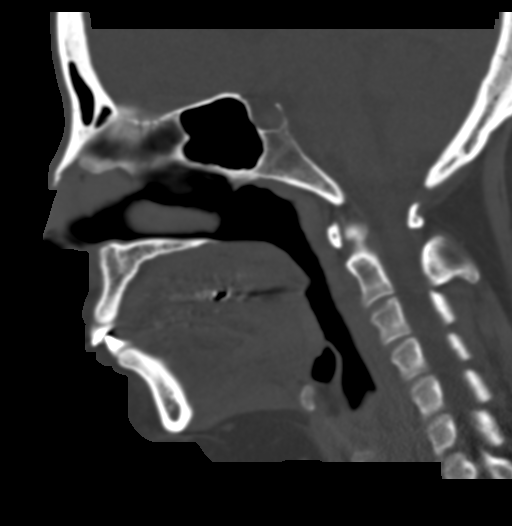
[im 50/75  bone]
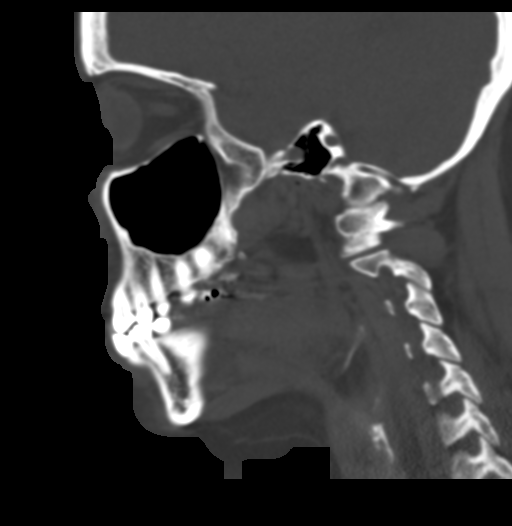

[16 of 47 positions shown; findings below may reference images not displayed]

FINDINGS: Osseous: There is a small displaced fracture at the tip of the nasal
bone. No other acute facial bone fractures.

Orbits: Negative. No traumatic or inflammatory finding.

Sinuses: Clear.

Soft tissues: Soft tissue swelling and loop small laceration
overlying the nasal bridge. Remaining soft tissues are unremarkable.

Limited intracranial: No significant or unexpected finding.
IMPRESSION: 1. Minimally displaced fracture at the tip of the nasal bone, with
overlying soft tissue swelling and small laceration.

## 2021-03-23 ENCOUNTER — Other Ambulatory Visit: Payer: Self-pay

## 2021-03-23 ENCOUNTER — Emergency Department (HOSPITAL_COMMUNITY)
Admission: EM | Admit: 2021-03-23 | Discharge: 2021-03-23 | Disposition: A | Discharge status: Home / Self Care | Payer: Commercial Managed Care - PPO | Attending: Emergency Medicine | Admitting: Emergency Medicine

## 2021-03-23 ENCOUNTER — Encounter (HOSPITAL_COMMUNITY): Payer: Self-pay

## 2021-03-23 DIAGNOSIS — R112 Nausea with vomiting, unspecified: Secondary | ICD-10-CM | POA: Insufficient documentation

## 2021-03-23 DIAGNOSIS — Z5321 Procedure and treatment not carried out due to patient leaving prior to being seen by health care provider: Secondary | ICD-10-CM | POA: Diagnosis not present

## 2021-03-23 DIAGNOSIS — R42 Dizziness and giddiness: Secondary | ICD-10-CM | POA: Diagnosis not present

## 2021-03-23 LAB — COMPREHENSIVE METABOLIC PANEL
ALT: 45 U/L — ABNORMAL HIGH (ref 0–44)
AST: 43 U/L — ABNORMAL HIGH (ref 15–41)
Albumin: 4.3 g/dL (ref 3.5–5.0)
Alkaline Phosphatase: 67 U/L (ref 38–126)
Anion gap: 17 — ABNORMAL HIGH (ref 5–15)
BUN: 16 mg/dL (ref 6–20)
CO2: 19 mmol/L — ABNORMAL LOW (ref 22–32)
Calcium: 9.4 mg/dL (ref 8.9–10.3)
Chloride: 102 mmol/L (ref 98–111)
Creatinine, Ser: 0.63 mg/dL (ref 0.44–1.00)
GFR, Estimated: >60 mL/min (ref >60)
Glucose, Bld: 158 mg/dL — ABNORMAL HIGH (ref 70–99)
Potassium: 2.8 mmol/L — ABNORMAL LOW (ref 3.5–5.1)
Sodium: 138 mmol/L (ref 135–145)
Total Bilirubin: 1 mg/dL (ref 0.3–1.2)
Total Protein: 8 g/dL (ref 6.5–8.1)

## 2021-03-23 LAB — LIPASE, BLOOD: Lipase: 22 U/L (ref 11–51)

## 2021-03-23 LAB — CBC
HCT: 38.6 % (ref 36.0–46.0)
Hemoglobin: 13.5 g/dL (ref 12.0–15.0)
MCH: 30.8 pg (ref 26.0–34.0)
MCHC: 35 g/dL (ref 30.0–36.0)
MCV: 87.9 fL (ref 80.0–100.0)
Platelets: 301 10*3/uL (ref 150–400)
RBC: 4.39 MIL/uL (ref 3.87–5.11)
RDW: 15.1 % (ref 11.5–15.5)
WBC: 8.4 10*3/uL (ref 4.0–10.5)
nRBC: 0 % (ref 0.0–0.2)

## 2021-03-23 NOTE — ED Triage Notes (Signed)
Patient c/o N/V and lightheadedness since yesterday. Patient also c/o muscle cramping and aches. Patient states she developed lightheadedness today.

## 2021-09-15 ENCOUNTER — Encounter (HOSPITAL_COMMUNITY): Payer: Self-pay

## 2021-09-15 ENCOUNTER — Emergency Department (HOSPITAL_COMMUNITY)
Admission: EM | Admit: 2021-09-15 | Discharge: 2021-09-15 | Disposition: A | Discharge status: Home / Self Care | Payer: Managed Care, Other (non HMO) | Attending: Emergency Medicine | Admitting: Emergency Medicine

## 2021-09-15 ENCOUNTER — Other Ambulatory Visit: Payer: Self-pay

## 2021-09-15 ENCOUNTER — Emergency Department (HOSPITAL_COMMUNITY): Payer: Managed Care, Other (non HMO)

## 2021-09-15 DIAGNOSIS — S93401A Sprain of unspecified ligament of right ankle, initial encounter: Secondary | ICD-10-CM | POA: Diagnosis not present

## 2021-09-15 DIAGNOSIS — Z7982 Long term (current) use of aspirin: Secondary | ICD-10-CM | POA: Insufficient documentation

## 2021-09-15 DIAGNOSIS — X500XXA Overexertion from strenuous movement or load, initial encounter: Secondary | ICD-10-CM | POA: Diagnosis not present

## 2021-09-15 DIAGNOSIS — S99911A Unspecified injury of right ankle, initial encounter: Secondary | ICD-10-CM | POA: Diagnosis present

## 2022-05-29 DIAGNOSIS — E669 Obesity, unspecified: Secondary | ICD-10-CM | POA: Diagnosis not present

## 2022-05-29 DIAGNOSIS — Z1330 Encounter for screening examination for mental health and behavioral disorders, unspecified: Secondary | ICD-10-CM | POA: Diagnosis not present

## 2022-06-04 DIAGNOSIS — F411 Generalized anxiety disorder: Secondary | ICD-10-CM | POA: Diagnosis not present

## 2022-06-04 DIAGNOSIS — F3171 Bipolar disorder, in partial remission, most recent episode hypomanic: Secondary | ICD-10-CM | POA: Diagnosis not present

## 2022-06-11 DIAGNOSIS — F319 Bipolar disorder, unspecified: Secondary | ICD-10-CM | POA: Diagnosis not present

## 2022-06-11 DIAGNOSIS — F1011 Alcohol abuse, in remission: Secondary | ICD-10-CM | POA: Diagnosis not present

## 2022-06-11 DIAGNOSIS — E669 Obesity, unspecified: Secondary | ICD-10-CM | POA: Diagnosis not present

## 2022-06-11 DIAGNOSIS — F419 Anxiety disorder, unspecified: Secondary | ICD-10-CM | POA: Diagnosis not present

## 2022-06-11 DIAGNOSIS — I10 Essential (primary) hypertension: Secondary | ICD-10-CM | POA: Diagnosis not present

## 2022-08-08 DIAGNOSIS — R1110 Vomiting, unspecified: Secondary | ICD-10-CM | POA: Diagnosis not present

## 2022-08-08 DIAGNOSIS — I10 Essential (primary) hypertension: Secondary | ICD-10-CM | POA: Diagnosis not present

## 2022-08-08 DIAGNOSIS — E876 Hypokalemia: Secondary | ICD-10-CM | POA: Diagnosis not present

## 2022-08-08 DIAGNOSIS — M7918 Myalgia, other site: Secondary | ICD-10-CM | POA: Diagnosis not present

## 2022-08-08 DIAGNOSIS — R252 Cramp and spasm: Secondary | ICD-10-CM | POA: Diagnosis not present

## 2022-08-08 DIAGNOSIS — R112 Nausea with vomiting, unspecified: Secondary | ICD-10-CM | POA: Diagnosis not present

## 2022-08-10 DIAGNOSIS — E876 Hypokalemia: Secondary | ICD-10-CM | POA: Diagnosis not present

## 2022-08-10 DIAGNOSIS — R112 Nausea with vomiting, unspecified: Secondary | ICD-10-CM | POA: Diagnosis not present

## 2022-08-10 DIAGNOSIS — I10 Essential (primary) hypertension: Secondary | ICD-10-CM | POA: Diagnosis not present

## 2022-08-14 DIAGNOSIS — Z131 Encounter for screening for diabetes mellitus: Secondary | ICD-10-CM | POA: Diagnosis not present

## 2022-08-14 DIAGNOSIS — E7849 Other hyperlipidemia: Secondary | ICD-10-CM | POA: Diagnosis not present

## 2022-08-14 DIAGNOSIS — R000 Tachycardia, unspecified: Secondary | ICD-10-CM | POA: Diagnosis not present

## 2022-08-14 DIAGNOSIS — Z0001 Encounter for general adult medical examination with abnormal findings: Secondary | ICD-10-CM | POA: Diagnosis not present

## 2022-08-30 DIAGNOSIS — E876 Hypokalemia: Secondary | ICD-10-CM | POA: Diagnosis not present

## 2022-08-30 DIAGNOSIS — F320 Major depressive disorder, single episode, mild: Secondary | ICD-10-CM | POA: Diagnosis not present

## 2022-08-30 DIAGNOSIS — E0590 Thyrotoxicosis, unspecified without thyrotoxic crisis or storm: Secondary | ICD-10-CM | POA: Diagnosis not present

## 2022-08-30 DIAGNOSIS — E7849 Other hyperlipidemia: Secondary | ICD-10-CM | POA: Diagnosis not present

## 2022-08-30 DIAGNOSIS — I10 Essential (primary) hypertension: Secondary | ICD-10-CM | POA: Diagnosis not present

## 2022-08-30 DIAGNOSIS — Z1231 Encounter for screening mammogram for malignant neoplasm of breast: Secondary | ICD-10-CM | POA: Diagnosis not present

## 2022-08-30 DIAGNOSIS — R8290 Unspecified abnormal findings in urine: Secondary | ICD-10-CM | POA: Diagnosis not present

## 2022-08-30 DIAGNOSIS — Z124 Encounter for screening for malignant neoplasm of cervix: Secondary | ICD-10-CM | POA: Diagnosis not present

## 2022-08-30 DIAGNOSIS — Z0001 Encounter for general adult medical examination with abnormal findings: Secondary | ICD-10-CM | POA: Diagnosis not present

## 2022-08-30 DIAGNOSIS — Z23 Encounter for immunization: Secondary | ICD-10-CM | POA: Diagnosis not present

## 2022-08-31 DIAGNOSIS — F3171 Bipolar disorder, in partial remission, most recent episode hypomanic: Secondary | ICD-10-CM | POA: Diagnosis not present

## 2022-08-31 DIAGNOSIS — F411 Generalized anxiety disorder: Secondary | ICD-10-CM | POA: Diagnosis not present

## 2022-09-05 ENCOUNTER — Other Ambulatory Visit: Payer: Self-pay

## 2022-09-05 DIAGNOSIS — Z1231 Encounter for screening mammogram for malignant neoplasm of breast: Secondary | ICD-10-CM

## 2022-10-11 DIAGNOSIS — R8290 Unspecified abnormal findings in urine: Secondary | ICD-10-CM | POA: Diagnosis not present

## 2022-10-11 DIAGNOSIS — E876 Hypokalemia: Secondary | ICD-10-CM | POA: Diagnosis not present

## 2022-10-11 DIAGNOSIS — Z0001 Encounter for general adult medical examination with abnormal findings: Secondary | ICD-10-CM | POA: Diagnosis not present

## 2022-10-11 DIAGNOSIS — E0590 Thyrotoxicosis, unspecified without thyrotoxic crisis or storm: Secondary | ICD-10-CM | POA: Diagnosis not present

## 2022-11-23 DIAGNOSIS — E7849 Other hyperlipidemia: Secondary | ICD-10-CM | POA: Diagnosis not present

## 2022-11-23 DIAGNOSIS — I10 Essential (primary) hypertension: Secondary | ICD-10-CM | POA: Diagnosis not present

## 2022-11-29 DIAGNOSIS — F411 Generalized anxiety disorder: Secondary | ICD-10-CM | POA: Diagnosis not present

## 2022-11-29 DIAGNOSIS — F3171 Bipolar disorder, in partial remission, most recent episode hypomanic: Secondary | ICD-10-CM | POA: Diagnosis not present

## 2022-12-11 DIAGNOSIS — Z23 Encounter for immunization: Secondary | ICD-10-CM | POA: Diagnosis not present

## 2022-12-11 DIAGNOSIS — F1090 Alcohol use, unspecified, uncomplicated: Secondary | ICD-10-CM | POA: Diagnosis not present

## 2022-12-11 DIAGNOSIS — E6609 Other obesity due to excess calories: Secondary | ICD-10-CM | POA: Diagnosis not present

## 2022-12-11 DIAGNOSIS — Z6834 Body mass index (BMI) 34.0-34.9, adult: Secondary | ICD-10-CM | POA: Diagnosis not present

## 2023-01-14 IMAGING — CR DG ANKLE COMPLETE 3+V*R*
3 series · 3 of 3 positions shown · non-contrast
Comparison: None Available.

CLINICAL DATA: Right ankle injury

EXAM:
RIGHT ANKLE - COMPLETE 3+ VIEW

[x ankle ap right]
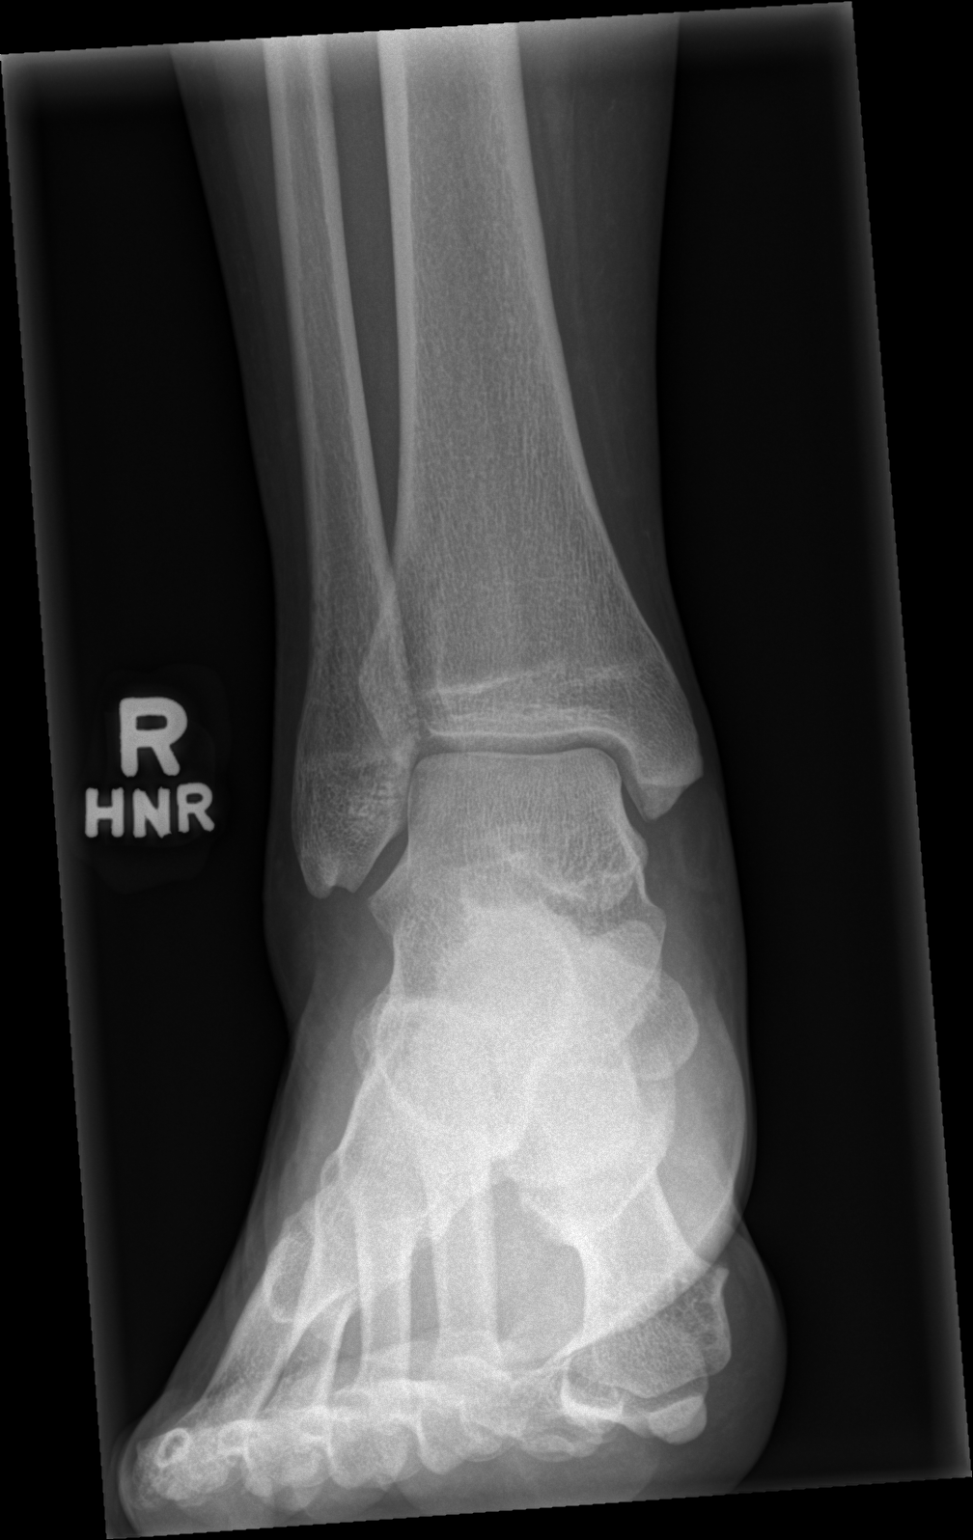

[x ankle obl right]
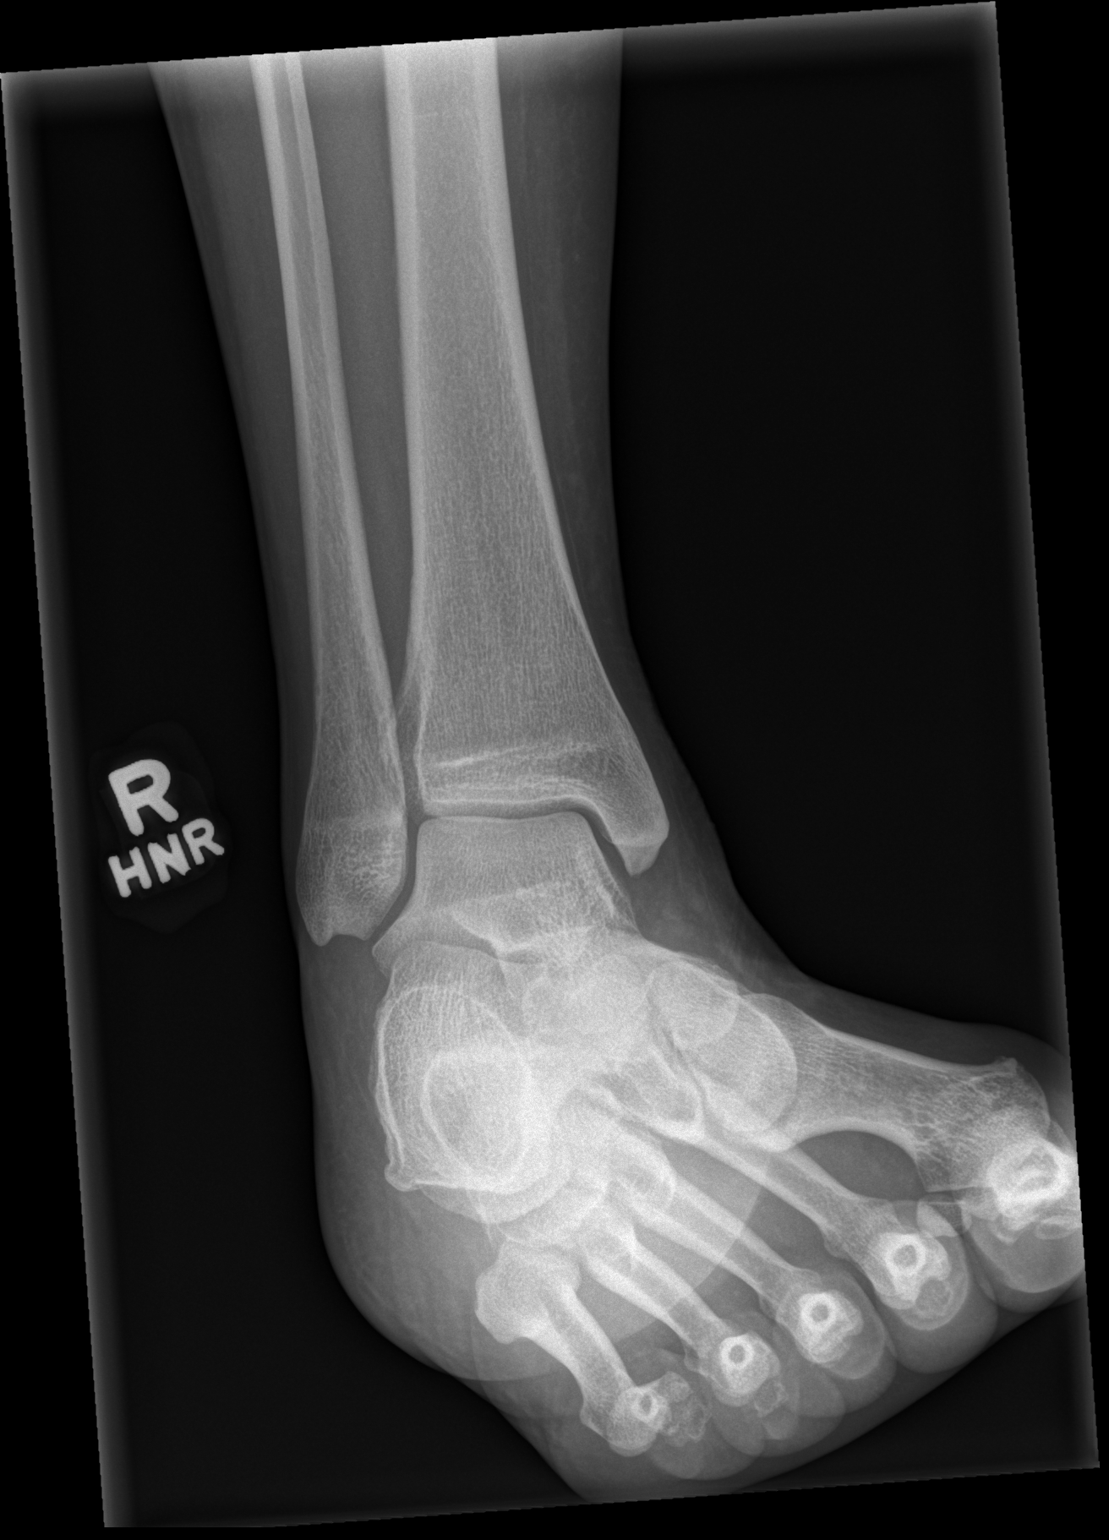

[x ankle lat right]
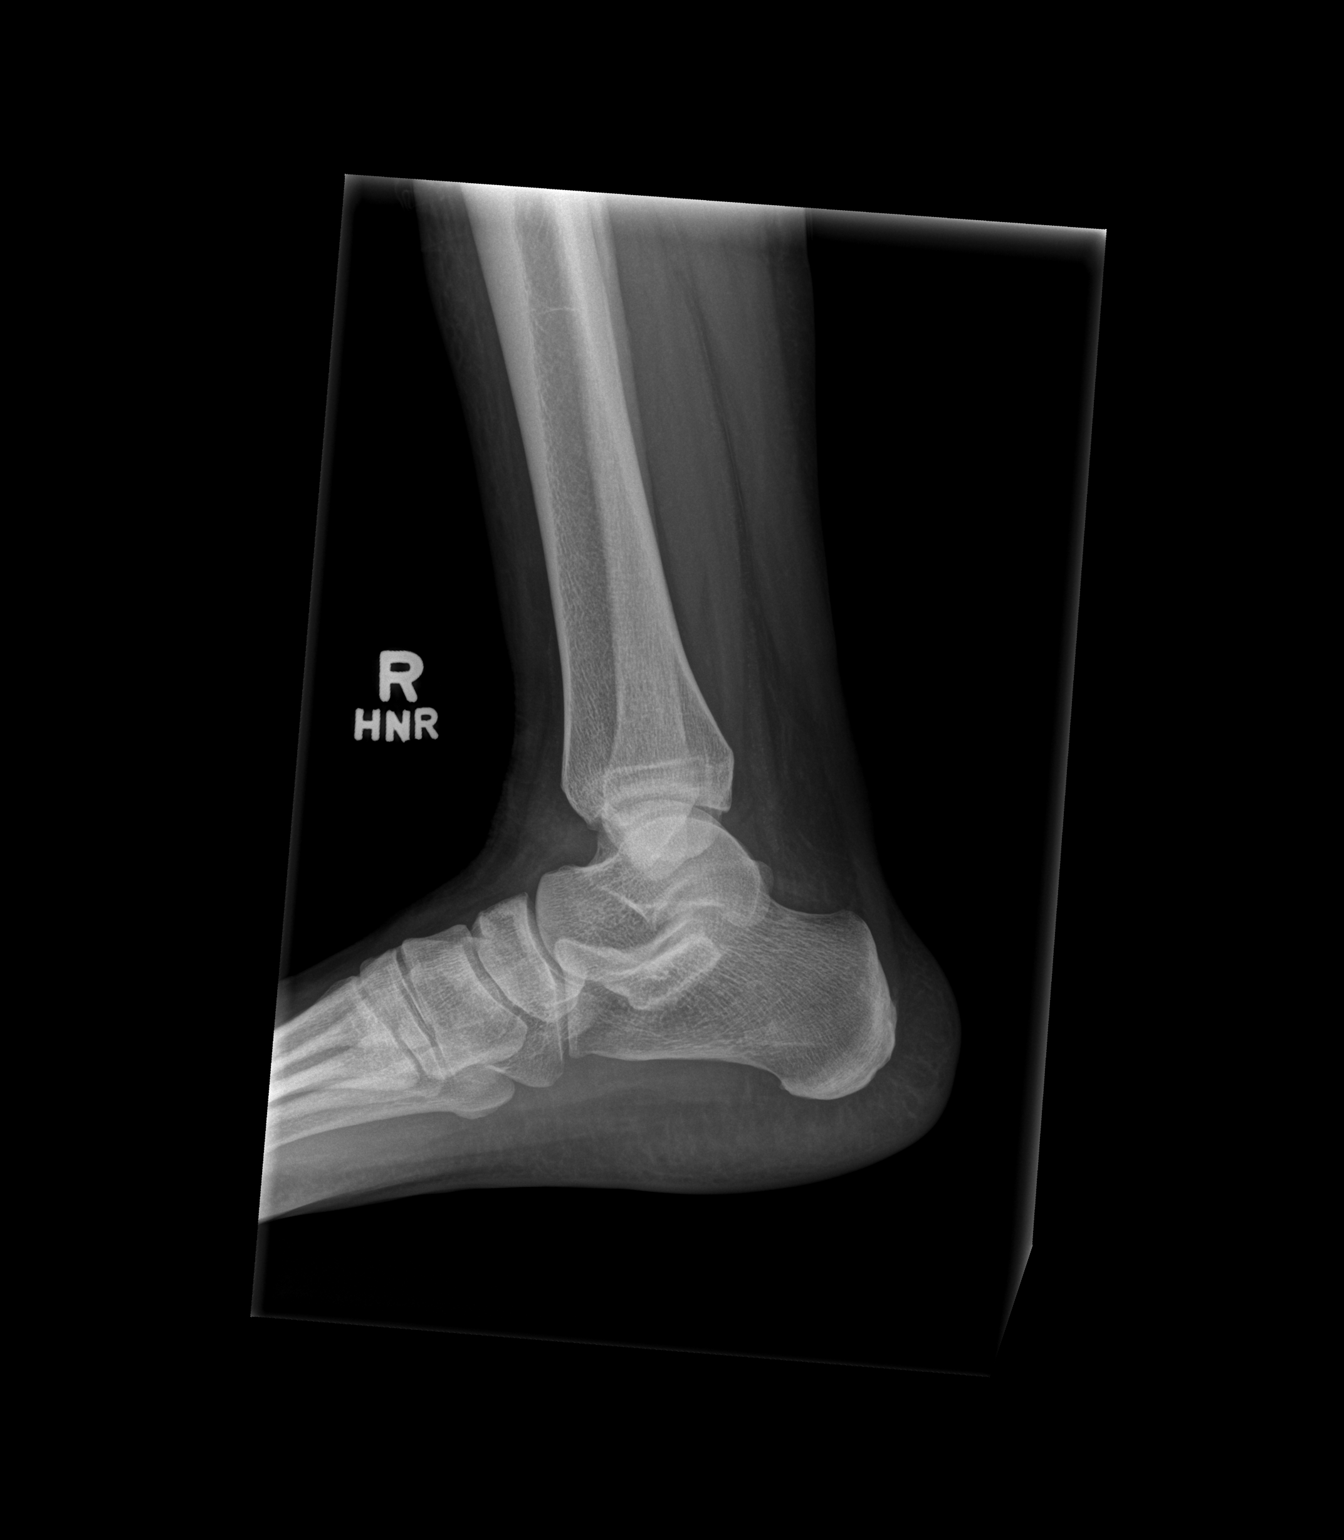

[3 of 3 positions shown; findings below may reference images not displayed]

FINDINGS: There is no evidence of fracture, dislocation, or joint effusion.
There is no evidence of arthropathy or other focal bone abnormality.
Soft tissues are unremarkable.
IMPRESSION: Negative.

## 2023-02-27 DIAGNOSIS — F3171 Bipolar disorder, in partial remission, most recent episode hypomanic: Secondary | ICD-10-CM | POA: Diagnosis not present

## 2023-02-27 DIAGNOSIS — F411 Generalized anxiety disorder: Secondary | ICD-10-CM | POA: Diagnosis not present

## 2023-03-25 DIAGNOSIS — I10 Essential (primary) hypertension: Secondary | ICD-10-CM | POA: Diagnosis not present

## 2023-03-25 DIAGNOSIS — R079 Chest pain, unspecified: Secondary | ICD-10-CM | POA: Diagnosis not present

## 2023-03-25 DIAGNOSIS — R7989 Other specified abnormal findings of blood chemistry: Secondary | ICD-10-CM | POA: Diagnosis not present

## 2023-03-25 DIAGNOSIS — R0789 Other chest pain: Secondary | ICD-10-CM | POA: Diagnosis not present

## 2023-03-25 DIAGNOSIS — R000 Tachycardia, unspecified: Secondary | ICD-10-CM | POA: Diagnosis not present

## 2023-03-25 DIAGNOSIS — R918 Other nonspecific abnormal finding of lung field: Secondary | ICD-10-CM | POA: Diagnosis not present

## 2023-03-25 DIAGNOSIS — R0602 Shortness of breath: Secondary | ICD-10-CM | POA: Diagnosis not present

## 2023-05-14 DIAGNOSIS — I10 Essential (primary) hypertension: Secondary | ICD-10-CM | POA: Diagnosis not present

## 2023-05-14 DIAGNOSIS — Z6834 Body mass index (BMI) 34.0-34.9, adult: Secondary | ICD-10-CM | POA: Diagnosis not present

## 2023-05-14 DIAGNOSIS — E6609 Other obesity due to excess calories: Secondary | ICD-10-CM | POA: Diagnosis not present

## 2023-05-14 DIAGNOSIS — F330 Major depressive disorder, recurrent, mild: Secondary | ICD-10-CM | POA: Diagnosis not present

## 2023-05-22 DIAGNOSIS — F319 Bipolar disorder, unspecified: Secondary | ICD-10-CM | POA: Diagnosis not present

## 2023-05-22 DIAGNOSIS — E669 Obesity, unspecified: Secondary | ICD-10-CM | POA: Diagnosis not present

## 2023-05-22 DIAGNOSIS — I10 Essential (primary) hypertension: Secondary | ICD-10-CM | POA: Diagnosis not present

## 2023-05-22 DIAGNOSIS — F419 Anxiety disorder, unspecified: Secondary | ICD-10-CM | POA: Diagnosis not present

## 2023-05-28 DIAGNOSIS — F3171 Bipolar disorder, in partial remission, most recent episode hypomanic: Secondary | ICD-10-CM | POA: Diagnosis not present

## 2023-05-28 DIAGNOSIS — F411 Generalized anxiety disorder: Secondary | ICD-10-CM | POA: Diagnosis not present

## 2023-07-17 DIAGNOSIS — I10 Essential (primary) hypertension: Secondary | ICD-10-CM | POA: Diagnosis not present

## 2023-07-17 DIAGNOSIS — F419 Anxiety disorder, unspecified: Secondary | ICD-10-CM | POA: Diagnosis not present

## 2023-07-17 DIAGNOSIS — F319 Bipolar disorder, unspecified: Secondary | ICD-10-CM | POA: Diagnosis not present

## 2023-07-17 DIAGNOSIS — E669 Obesity, unspecified: Secondary | ICD-10-CM | POA: Diagnosis not present

## 2023-08-23 DIAGNOSIS — F411 Generalized anxiety disorder: Secondary | ICD-10-CM | POA: Diagnosis not present

## 2023-08-23 DIAGNOSIS — F3171 Bipolar disorder, in partial remission, most recent episode hypomanic: Secondary | ICD-10-CM | POA: Diagnosis not present

## 2023-09-02 DIAGNOSIS — I10 Essential (primary) hypertension: Secondary | ICD-10-CM | POA: Diagnosis not present

## 2023-10-18 DIAGNOSIS — E7849 Other hyperlipidemia: Secondary | ICD-10-CM | POA: Diagnosis not present

## 2023-10-18 DIAGNOSIS — I10 Essential (primary) hypertension: Secondary | ICD-10-CM | POA: Diagnosis not present

## 2023-10-18 DIAGNOSIS — E559 Vitamin D deficiency, unspecified: Secondary | ICD-10-CM | POA: Diagnosis not present

## 2023-10-18 DIAGNOSIS — E0590 Thyrotoxicosis, unspecified without thyrotoxic crisis or storm: Secondary | ICD-10-CM | POA: Diagnosis not present

## 2023-10-23 DIAGNOSIS — Z0001 Encounter for general adult medical examination with abnormal findings: Secondary | ICD-10-CM | POA: Diagnosis not present

## 2023-10-23 DIAGNOSIS — F330 Major depressive disorder, recurrent, mild: Secondary | ICD-10-CM | POA: Diagnosis not present

## 2023-10-23 DIAGNOSIS — Z23 Encounter for immunization: Secondary | ICD-10-CM | POA: Diagnosis not present

## 2023-10-23 DIAGNOSIS — I10 Essential (primary) hypertension: Secondary | ICD-10-CM | POA: Diagnosis not present

## 2023-10-23 DIAGNOSIS — Z6837 Body mass index (BMI) 37.0-37.9, adult: Secondary | ICD-10-CM | POA: Diagnosis not present

## 2023-10-23 DIAGNOSIS — Z1211 Encounter for screening for malignant neoplasm of colon: Secondary | ICD-10-CM | POA: Diagnosis not present

## 2023-10-23 DIAGNOSIS — Z136 Encounter for screening for cardiovascular disorders: Secondary | ICD-10-CM | POA: Diagnosis not present

## 2023-10-28 DIAGNOSIS — I10 Essential (primary) hypertension: Secondary | ICD-10-CM | POA: Diagnosis not present

## 2023-11-06 DIAGNOSIS — F3171 Bipolar disorder, in partial remission, most recent episode hypomanic: Secondary | ICD-10-CM | POA: Diagnosis not present

## 2023-11-06 DIAGNOSIS — F411 Generalized anxiety disorder: Secondary | ICD-10-CM | POA: Diagnosis not present

## 2023-12-11 DIAGNOSIS — Z23 Encounter for immunization: Secondary | ICD-10-CM | POA: Diagnosis not present

## 2023-12-11 DIAGNOSIS — E66811 Obesity, class 1: Secondary | ICD-10-CM | POA: Diagnosis not present

## 2023-12-11 DIAGNOSIS — I10 Essential (primary) hypertension: Secondary | ICD-10-CM | POA: Diagnosis not present

## 2023-12-11 DIAGNOSIS — R110 Nausea: Secondary | ICD-10-CM | POA: Diagnosis not present

## 2023-12-11 DIAGNOSIS — R300 Dysuria: Secondary | ICD-10-CM | POA: Diagnosis not present

## 2023-12-11 DIAGNOSIS — Z6834 Body mass index (BMI) 34.0-34.9, adult: Secondary | ICD-10-CM | POA: Diagnosis not present

## 2024-01-29 DIAGNOSIS — F411 Generalized anxiety disorder: Secondary | ICD-10-CM | POA: Diagnosis not present

## 2024-01-29 DIAGNOSIS — F331 Major depressive disorder, recurrent, moderate: Secondary | ICD-10-CM | POA: Diagnosis not present

## 2024-01-29 DIAGNOSIS — F3171 Bipolar disorder, in partial remission, most recent episode hypomanic: Secondary | ICD-10-CM | POA: Diagnosis not present
